# Patient Record
Sex: Female | Born: 1975
Health system: Southern US, Community
[De-identification: ages and names within clinical notes are randomized; demographics above are authoritative.]

## PROBLEM LIST (undated history)

## (undated) ENCOUNTER — Ambulatory Visit (HOSPITAL_COMMUNITY): Discharge status: Absent without leave | Payer: Self-pay

## (undated) DIAGNOSIS — Z9009 Acquired absence of other part of head and neck: Secondary | ICD-10-CM

## (undated) DIAGNOSIS — N6019 Diffuse cystic mastopathy of unspecified breast: Secondary | ICD-10-CM

## (undated) DIAGNOSIS — N809 Endometriosis, unspecified: Secondary | ICD-10-CM

## (undated) DIAGNOSIS — E890 Postprocedural hypothyroidism: Secondary | ICD-10-CM

## (undated) DIAGNOSIS — N879 Dysplasia of cervix uteri, unspecified: Secondary | ICD-10-CM

## (undated) DIAGNOSIS — E079 Disorder of thyroid, unspecified: Secondary | ICD-10-CM

## (undated) HISTORY — PX: THYROIDECTOMY: SHX17

## (undated) HISTORY — DX: Diffuse cystic mastopathy of unspecified breast: N60.19

## (undated) HISTORY — DX: Dysplasia of cervix uteri, unspecified: N87.9

## (undated) HISTORY — DX: Acquired absence of other part of head and neck: Z90.09

## (undated) HISTORY — DX: Postprocedural hypothyroidism: E89.0

## (undated) HISTORY — DX: Endometriosis, unspecified: N80.9

## (undated) HISTORY — PX: APPENDECTOMY: SHX54

## (undated) HISTORY — PX: COLPOSCOPY: SHX161

---
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
## 1999-09-14 ENCOUNTER — Other Ambulatory Visit: Admission: RE | Admit: 1999-09-14 | Discharge: 1999-09-14 | Payer: Self-pay | Admitting: *Deleted

## 2000-10-20 ENCOUNTER — Other Ambulatory Visit: Admission: RE | Admit: 2000-10-20 | Discharge: 2000-10-20 | Payer: Self-pay | Admitting: Obstetrics and Gynecology

## 2003-03-11 ENCOUNTER — Other Ambulatory Visit: Admission: RE | Admit: 2003-03-11 | Discharge: 2003-03-11 | Payer: Self-pay | Admitting: *Deleted

## 2003-05-01 ENCOUNTER — Encounter: Admission: RE | Admit: 2003-05-01 | Discharge: 2003-05-01 | Payer: Self-pay | Admitting: Endocrinology

## 2003-05-01 ENCOUNTER — Encounter: Payer: Self-pay | Admitting: Endocrinology

## 2003-05-16 ENCOUNTER — Ambulatory Visit (HOSPITAL_COMMUNITY): Admission: RE | Admit: 2003-05-16 | Discharge: 2003-05-16 | Payer: Self-pay | Admitting: Endocrinology

## 2003-05-16 ENCOUNTER — Encounter: Payer: Self-pay | Admitting: Endocrinology

## 2003-06-30 ENCOUNTER — Ambulatory Visit (HOSPITAL_COMMUNITY): Admission: RE | Admit: 2003-06-30 | Discharge: 2003-07-01 | Payer: Self-pay | Admitting: Surgery

## 2004-02-13 ENCOUNTER — Other Ambulatory Visit: Admission: RE | Admit: 2004-02-13 | Discharge: 2004-02-13 | Payer: Self-pay | Admitting: Obstetrics and Gynecology

## 2004-03-30 ENCOUNTER — Inpatient Hospital Stay (HOSPITAL_COMMUNITY): Admission: AD | Admit: 2004-03-30 | Discharge: 2004-03-30 | Payer: Self-pay | Admitting: Obstetrics and Gynecology

## 2004-06-18 ENCOUNTER — Ambulatory Visit (HOSPITAL_COMMUNITY): Admission: RE | Admit: 2004-06-18 | Discharge: 2004-06-18 | Payer: Self-pay | Admitting: Obstetrics and Gynecology

## 2004-08-23 ENCOUNTER — Inpatient Hospital Stay (HOSPITAL_COMMUNITY): Admission: AD | Admit: 2004-08-23 | Discharge: 2004-08-26 | Payer: Self-pay | Admitting: Obstetrics and Gynecology

## 2004-10-04 ENCOUNTER — Other Ambulatory Visit: Admission: RE | Admit: 2004-10-04 | Discharge: 2004-10-04 | Payer: Self-pay | Admitting: Obstetrics and Gynecology

## 2004-12-03 ENCOUNTER — Encounter: Admission: RE | Admit: 2004-12-03 | Discharge: 2005-01-02 | Payer: Self-pay | Admitting: Obstetrics and Gynecology

## 2005-06-22 ENCOUNTER — Ambulatory Visit (HOSPITAL_COMMUNITY): Admission: RE | Admit: 2005-06-22 | Discharge: 2005-06-22 | Payer: Self-pay | Admitting: Obstetrics and Gynecology

## 2005-08-21 ENCOUNTER — Inpatient Hospital Stay (HOSPITAL_COMMUNITY): Admission: AD | Admit: 2005-08-21 | Discharge: 2005-08-23 | Payer: Self-pay | Admitting: Obstetrics and Gynecology

## 2005-10-04 ENCOUNTER — Other Ambulatory Visit: Admission: RE | Admit: 2005-10-04 | Discharge: 2005-10-04 | Payer: Self-pay | Admitting: Obstetrics and Gynecology

## 2011-08-16 DIAGNOSIS — N879 Dysplasia of cervix uteri, unspecified: Secondary | ICD-10-CM | POA: Insufficient documentation

## 2011-08-16 HISTORY — DX: Dysplasia of cervix uteri, unspecified: N87.9

## 2012-07-16 ENCOUNTER — Emergency Department (HOSPITAL_COMMUNITY)
Admission: EM | Admit: 2012-07-16 | Discharge: 2012-07-16 | Disposition: A | Discharge status: Home / Self Care | Payer: Self-pay | Source: Home / Self Care

## 2012-07-16 ENCOUNTER — Encounter (HOSPITAL_COMMUNITY): Payer: Self-pay

## 2012-07-16 DIAGNOSIS — Z760 Encounter for issue of repeat prescription: Secondary | ICD-10-CM

## 2012-07-16 DIAGNOSIS — E039 Hypothyroidism, unspecified: Secondary | ICD-10-CM

## 2012-07-16 HISTORY — DX: Disorder of thyroid, unspecified: E07.9

## 2012-07-16 LAB — TSH: TSH: 5.805 u[IU]/mL — ABNORMAL HIGH (ref 0.350–4.500)

## 2012-07-16 MED ORDER — LEVOTHYROXINE SODIUM 125 MCG PO TABS: 125.0000 ug | ORAL_TABLET | Freq: Every day | ORAL | Status: DC

## 2012-07-16 NOTE — ED Notes (Signed)
Patient states just had physical [facility]  (07/03/2012) needs prescription for thyroid medication

## 2012-07-16 NOTE — ED Provider Notes (Addendum)
History     CSN: [id number]  Arrival date & time 07/16/12  1307   First MD Initiated Contact with Patient 07/16/12 1329      Chief Complaint  Patient presents with  . 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 Denies any headache, blurry vision, hoarseness of voice, chest pain, shortness of breath, cough, nausea, vomiting, ear pain, fever, chills, abdominal pain, bowel or urinary symptoms. She is currently using IUD and denies any menstrual symptoms. She denies any change in her weight or appetite. Past Medical History  Diagnosis Date  . Thyroid disease     Past Surgical History  Procedure Date  . Appendectomy     No family history on file.  History  Substance Use Topics  . Smoking status: Denies smoking  . Smokeless tobacco: Not on file  . Alcohol Use: denies etoh use    OB History    Grav Para Term Preterm Abortions TAB SAB Ect Mult Living                  Review of Systems As Outlined in history of present illness.  Allergies  Latex and Morphine and related  Home Medications   Current Outpatient Rx  Name  Route  Sig  Dispense  Refill  . L-LYSINE 1000 MG PO TABS   Oral   Take by mouth.         Marland Kitchen MAGNESIUM 500 MG PO TABS   Oral   Take by mouth.         . ONE-DAILY MULTI VITAMINS PO TABS   Oral   Take 1 tablet by mouth daily.         Marland Kitchen LEVOTHYROXINE SODIUM 125 MCG PO TABS   Oral   Take 1 tablet (125 mcg total) by mouth daily.   30 tablet   5     BP 112/75  Pulse 81  Temp 99.6 F (37.6 C) (Oral)  Resp 19  SpO2 100%  Physical Exam Aged female in no acute distress HEENT: No pallor, no icterus, moist oral mucosa Chest: Clear to auscultation bilaterally CVS: S1-S2 normal: No murmurs Abdomen: Soft, nontender Extremities: Warm, no edema CNS: AAO x3 ED Course  Procedures  (including critical care time)   Labs Reviewed  TSH ordered    No results found.   Assessment/ plan Middle Aged female here for medication refill for Synthroid which she takes 125 MCG daily. Not checked her TSH level in several months. She denies any symptoms. I will refill her Synthroid dose and check a TSH level and adjust dose accordingly if not normal. Counseled on  regular exercise.  Follow Up in 6 months  Total time spent : 15 minutes    MDM         Torianna Junio, MD 07/16/12 1352    ADDENDUM :  Patient's TSH elevated at 5.08. Will increase synthroid dose to 150 mcg daily. Needs follow up in 3  Months  to have TSH rechecked and dose readjusted.   Eddie North, MD 07/17/12 4098

## 2012-07-17 ENCOUNTER — Telehealth (HOSPITAL_COMMUNITY): Payer: Self-pay

## 2013-03-10 ENCOUNTER — Other Ambulatory Visit: Payer: Self-pay | Admitting: Internal Medicine

## 2013-03-12 ENCOUNTER — Other Ambulatory Visit: Payer: Self-pay | Admitting: Internal Medicine

## 2013-03-15 ENCOUNTER — Other Ambulatory Visit: Payer: Self-pay | Admitting: Family Medicine

## 2013-03-15 DIAGNOSIS — N644 Mastodynia: Secondary | ICD-10-CM

## 2013-04-01 ENCOUNTER — Other Ambulatory Visit: Payer: Self-pay | Admitting: Family Medicine

## 2013-04-01 ENCOUNTER — Other Ambulatory Visit: Payer: Self-pay

## 2013-04-01 ENCOUNTER — Ambulatory Visit
Admission: RE | Admit: 2013-04-01 | Discharge: 2013-04-01 | Disposition: A | Discharge status: Home / Self Care | Payer: BC Managed Care – PPO | Source: Ambulatory Visit | Attending: Family Medicine | Admitting: Family Medicine

## 2013-04-01 DIAGNOSIS — N644 Mastodynia: Secondary | ICD-10-CM

## 2013-05-03 ENCOUNTER — Inpatient Hospital Stay (HOSPITAL_COMMUNITY): Admission: RE | Admit: 2013-05-03 | Payer: Self-pay | Source: Ambulatory Visit | Admitting: Obstetrics & Gynecology

## 2013-11-22 IMAGING — MG MM DIGITAL DIAGNOSTIC BILAT
7 series · 7 of 7 positions shown · non-contrast
Comparison: None.

CLINICAL DATA: Patient presents for a bilateral diagnostic
mammogram due to diffuse bilateral breast pain and tenderness left
worse than right.  The patient also describes two small adjacent
palpable tender abnormalities towards the left axilla as well as a
single small palpable tender abnormality over the right axilla.

DIGITAL DIAGNOSTIC BILATERAL MAMMOGRAM WITH CAD AND BILATERAL
BREAST ULTRASOUND:

[L MLO]
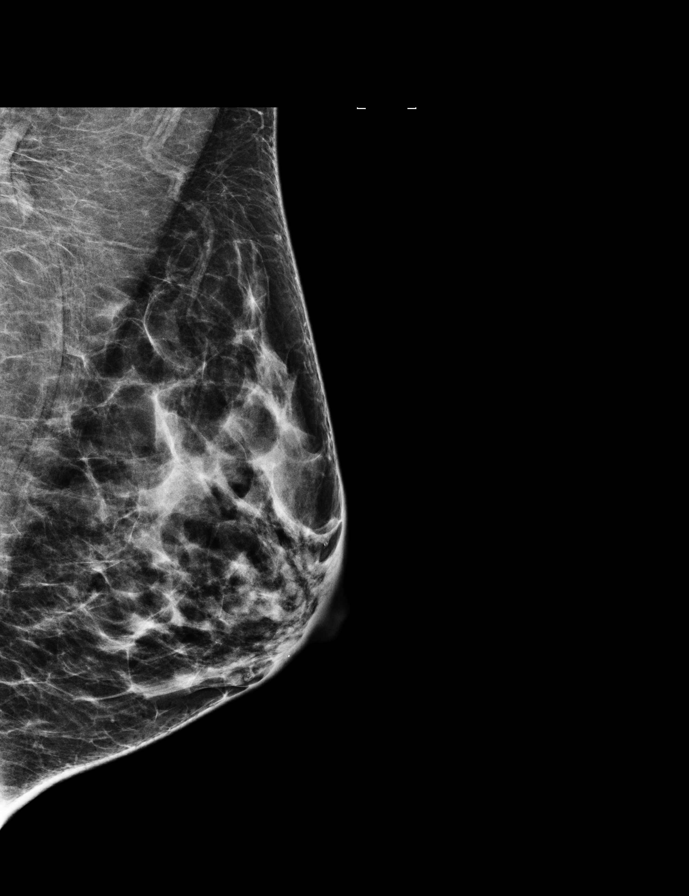

[R MLO]
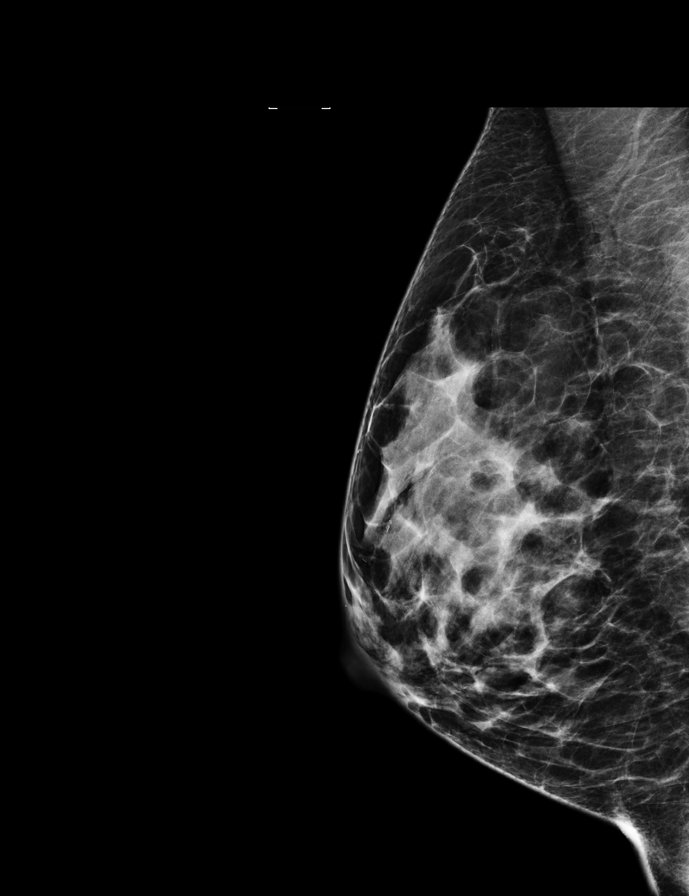

[L CC]
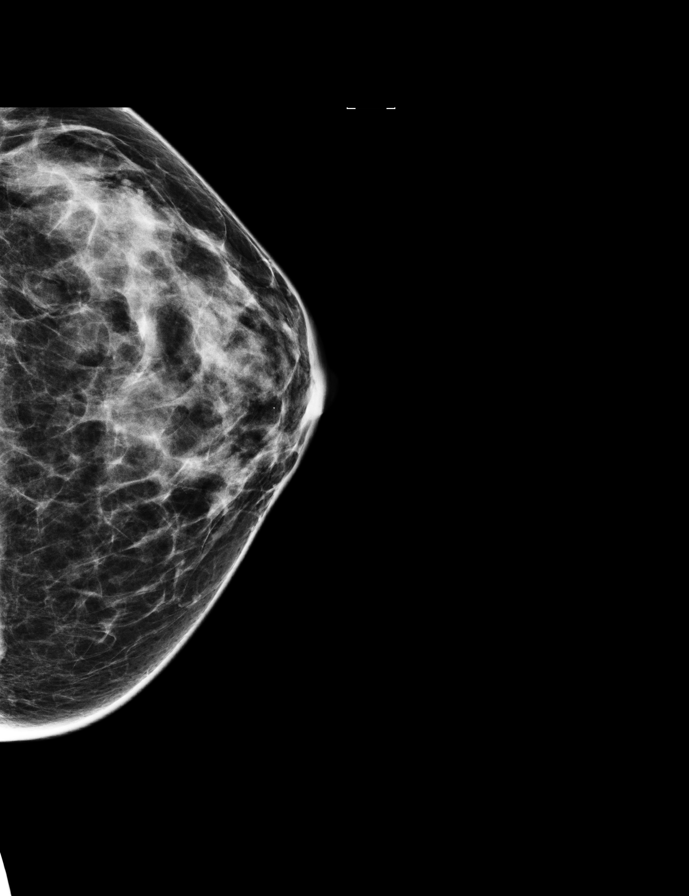

[L TAN]
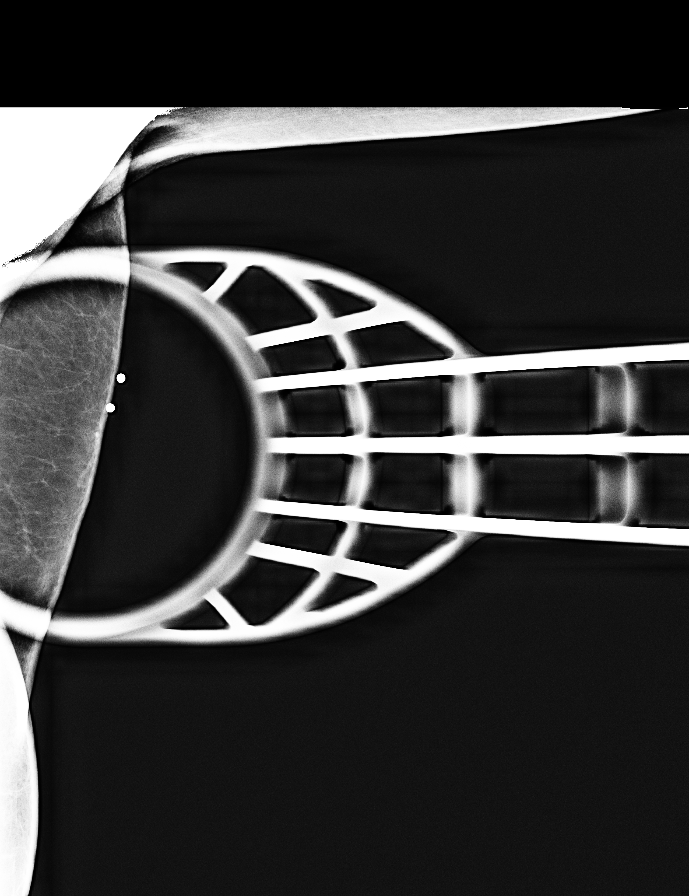

[R TAN]
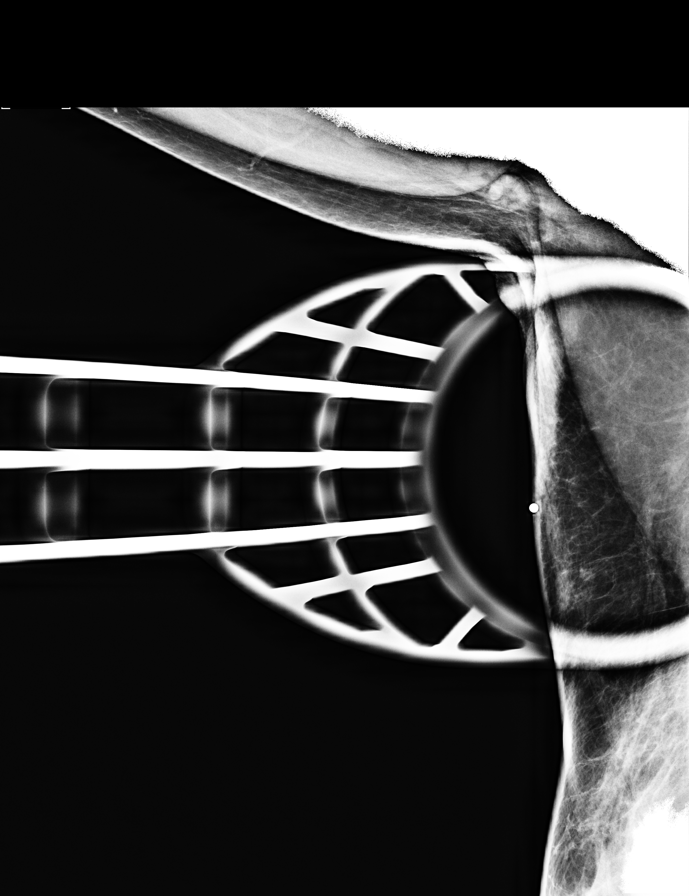

[R CC]
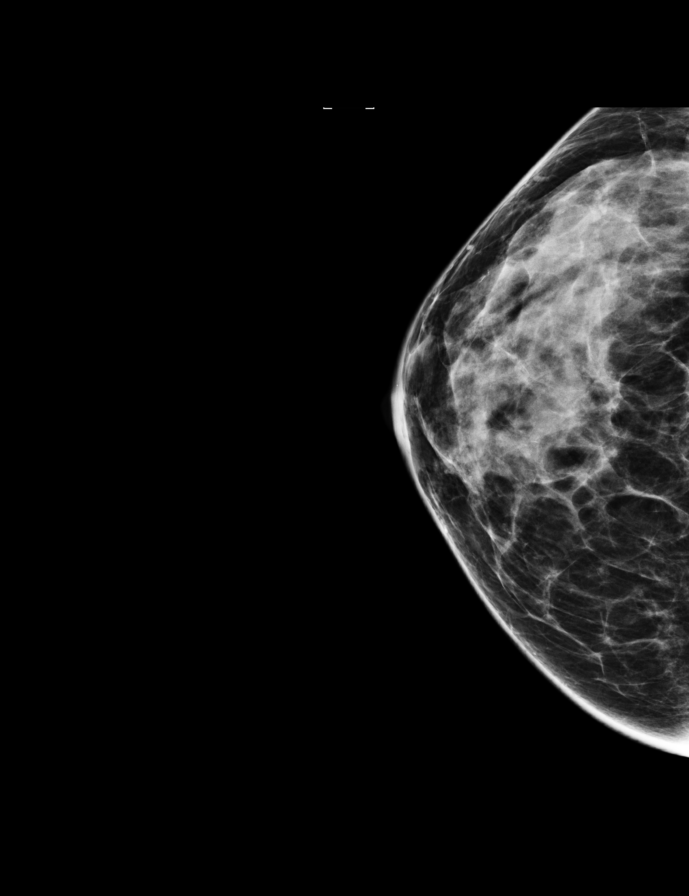

[L AT]
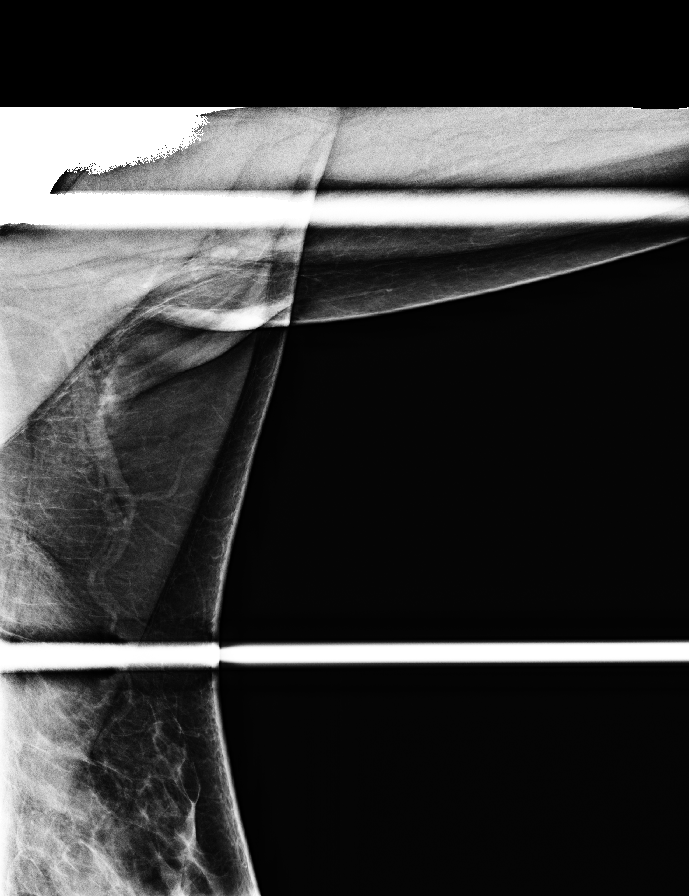

[7 of 7 positions shown; findings below may reference images not displayed]

FINDINGS: ACR Breast Density Category c:  The breast tissue is
heterogeneously dense, which may obscure small masses.

Examination demonstrates no focal abnormality over the axillary
region bilaterally to account for patient's palpable areas of
tenderness.  There are a few several bilateral vascular
calcifications present. No spiculated mass, distortion or
suspicious microcalcifications.

Mammographic images were processed with CAD.

On physical exam, I palpate no focal abnormality over the left
axillary region to correspond to patient's palpable abnormalities.
I palpate a very soft ovoid 8 mm palpable abnormality in the right
axillary region to correspond to patient's palpable abnormality.

Ultrasound is performed, showing no focal abnormality over the left
axillary region/upper outer quadrant to correspond to patient's
palpable abnormalities.  Ultrasound of the right axillary region
demonstrates a well defined ovoid hypoechoic mass just below the
skin measuring 3 x 8 x 8 mm corresponding to patient's palpable
area of tenderness.  This is compatible with a small lipoma.
IMPRESSION: No focal abnormality over the left axilla/upper outer breast to
correspond to patient's two adjacent palpable abnormalities.  3 x 8
x 8 mm lipoma over the right axilla corresponding to patient's
palpable abnormality.

RECOMMENDATION:
Recommend continued management of patient's left breast palpable
abnormalities and bilateral breast pain on a clinical basis.
Otherwise, recommend continued annual bilateral screening
mammography beginning at age 40.

I have discussed the findings and recommendations with the patient.
Results were also provided in writing at the conclusion of the
visit.  If applicable, a reminder letter will be sent to the
patient regarding the next appointment.

BI-RADS CATEGORY 2:  Benign finding(s).

## 2015-11-17 ENCOUNTER — Ambulatory Visit: Payer: Self-pay | Admitting: Gynecology

## 2015-12-02 ENCOUNTER — Other Ambulatory Visit: Payer: Self-pay | Admitting: Gynecology

## 2015-12-02 ENCOUNTER — Encounter: Payer: Self-pay | Admitting: Gynecology

## 2015-12-02 ENCOUNTER — Ambulatory Visit (INDEPENDENT_AMBULATORY_CARE_PROVIDER_SITE_OTHER): Payer: Self-pay | Admitting: Gynecology

## 2015-12-02 ENCOUNTER — Telehealth: Payer: Self-pay

## 2015-12-02 VITALS — BP 116/74 | Ht 66.0 in | Wt 133.0 lb

## 2015-12-02 DIAGNOSIS — Z01419 Encounter for gynecological examination (general) (routine) without abnormal findings: Secondary | ICD-10-CM

## 2015-12-02 MED ORDER — THYROID 65 MG PO TABS: 65.0000 mg | ORAL_TABLET | Freq: Every day | ORAL | Status: DC

## 2015-12-02 MED ORDER — THYROID 30 MG PO TABS: 30.0000 mg | ORAL_TABLET | Freq: Every day | ORAL | Status: DC

## 2015-12-02 MED ORDER — THYROID 65 MG PO TABS: ORAL_TABLET | ORAL | Status: DC

## 2015-12-02 NOTE — Progress Notes (Signed)
    Belinda OatsKelly E Tucker 26-Oct-1975 [id number]        40 y.o.  G2P2 new patient to establish care. History to include abnormal Pap smear 2013 fortunately had colposcopy but no reported treatment. Also history of laparoscopy 1996 with diagnosis of endometriosis. Having regular monthly menses. Not using contraception as she is not sexually active. Also has a history of total thyroidectomy for goiter and is on thyroid replacement which she has run out of one of her medications.  Patient has no reported GYN issues at this time  Past medical history,surgical history, problem list, medications, allergies, family history and social history were all reviewed and documented as reviewed in the EPIC chart.  ROS:  Performed with pertinent positives and negatives included in the history, assessment and plan.   Additional significant findings :  none   Exam: Kennon PortelaKim Gardner assistant Filed Vitals:   12/02/15 1132  BP: 116/74  Height: 5\' 6"  (1.676 m)  Weight: 133 lb (60.328 kg)   General appearance:  Normal affect, orientation and appearance. Skin: Grossly normal HEENT: Without gross lesions.  No cervical or supraclavicular adenopathy. Thyroid normal.  Lungs:  Clear without wheezing, rales or rhonchi Cardiac: RR, without RMG Abdominal:  Soft, nontender, without masses, guarding, rebound, organomegaly or hernia Breasts:  Examined lying and sitting without masses, retractions, discharge or axillary adenopathy. Pelvic:  Ext/BUS/vagina normal  Cervix normal. Pap smear done  Uterus anteverted, normal size, shape and contour, midline and mobile nontender   Adnexa without masses or tenderness    Anus and perineum normal   Rectovaginal normal sphincter tone without palpated masses or tenderness.    Assessment/Plan:  40 y.o. G2P2 female for annual exam with regular menses, abstinent contraception.   1. History of thyroidectomy. Requesting refills of her Armour Thyroid. She takes 65 mg in the morning and 15 mg  in the afternoon. I refilled her 2 months. We will obtain copies of her most recent lab work that she reports about 3-4 months ago at another facility. If for some reason we cannot obtain these and I'll check a baseline TSH before refilling her again. Did not check today as she has not been taking her 15 mg dose as she ran out. 2. History of cervical dysplasia. I do not have copies of these Pap smears but she reports no treatment rendered other than colposcopy and a negative Pap smear afterwards. Pap smear was done today. Assuming negative then we'll follow expectantly. 3. Breast health. Reports mammogram 2-1/2 years ago. No abnormalities on exam. Reviewed screening mammographic recommendations and she plans on repeating her mammogram next year at age 40. SBE monthly reviewed. 4. Contraception. Patient not sexually active and does not plan to be any time soon. Will follow up for contraceptive discussion if/when she anticipates reinitiation. 5. Health maintenance. No routine lab work done as patient reports routine lab work done at another facility this past year and she is paying cash and prefers not to have any lab work done at this time. Follow up with her labwork from elsewhere for my review otherwise follow up in 2 months before she runs out of her thyroid replacement for check of TSH.   Dara LordsFONTAINE,[place] Perry P MD, 12:11 PM 12/02/2015

## 2015-12-02 NOTE — Telephone Encounter (Signed)
I spoke with patient who confirmed she is on 65 mg. . I called the pharmacy back and upon review he told me what she has been taking is Ashby Dawesature Thyroid 65 mg. Just a different brand from Armour but substituable with each other. I called patient and she confirmed the Ashby Dawesature brand is what she has been on. I sent Rx for that to pharmacy. Patient informed.

## 2015-12-02 NOTE — Addendum Note (Signed)
Addended by: Dayna BarkerGARDNER, Alexah Kivett K on: 12/02/2015 12:25 PM   Modules accepted: Orders

## 2015-12-02 NOTE — Telephone Encounter (Signed)
Please check with the patient and tell her what the pharmacist said and see if 60 mg is the appropriate dose versus 65 like she listed for us. If so then let the pharmacy know

## 2015-12-02 NOTE — Telephone Encounter (Signed)
Pharmacist said he received Rx for Armour Thyroid 65 mg. But it does not come in 65 mg. It does come in 60 mg.

## 2015-12-02 NOTE — Addendum Note (Signed)
Addended by: Keenan BachelorANNAS, Erica Richwine R on: 12/02/2015 02:18 PM   Modules accepted: Orders

## 2015-12-02 NOTE — Patient Instructions (Signed)

## 2015-12-03 LAB — PAP IG W/ RFLX HPV ASCU

## 2015-12-14 ENCOUNTER — Telehealth: Payer: Self-pay | Admitting: *Deleted

## 2015-12-14 NOTE — Telephone Encounter (Signed)
Pt blood work from [facility] came I will place on your desk to review.

## 2015-12-15 MED ORDER — THYROID 65 MG PO TABS: ORAL_TABLET | ORAL | Status: DC

## 2015-12-15 NOTE — Telephone Encounter (Signed)
Tell patient her labs all look good. This includes her thyroid. Refill her thyroid medication 1 year. They did a testosterone level which was normal. She had a minimal elevation in the sex hormone binding globulin which is not unusual and nothing needs to be done with this.  Not sure why they ordered it.

## 2015-12-15 NOTE — Telephone Encounter (Signed)
Pt informed with the below,pt [facility] the [facility] 65 mg brand sent to pharmacy. Rx sent.

## 2016-02-01 ENCOUNTER — Other Ambulatory Visit: Payer: Self-pay

## 2016-02-22 ENCOUNTER — Other Ambulatory Visit: Payer: Self-pay | Admitting: Gynecology

## 2016-02-22 NOTE — Telephone Encounter (Signed)
Okay to refill however she normally takes it

## 2016-07-15 ENCOUNTER — Other Ambulatory Visit: Payer: Self-pay | Admitting: Gynecology

## 2016-10-27 ENCOUNTER — Other Ambulatory Visit: Payer: Self-pay | Admitting: Gynecology

## 2016-12-05 ENCOUNTER — Encounter: Payer: Self-pay | Admitting: Gynecology

## 2016-12-26 ENCOUNTER — Ambulatory Visit (INDEPENDENT_AMBULATORY_CARE_PROVIDER_SITE_OTHER): Payer: Self-pay | Admitting: Gynecology

## 2016-12-26 ENCOUNTER — Encounter: Payer: Self-pay | Admitting: Gynecology

## 2016-12-26 VITALS — BP 116/70 | Ht 65.5 in | Wt 122.0 lb

## 2016-12-26 DIAGNOSIS — E039 Hypothyroidism, unspecified: Secondary | ICD-10-CM

## 2016-12-26 DIAGNOSIS — Z01419 Encounter for gynecological examination (general) (routine) without abnormal findings: Secondary | ICD-10-CM

## 2016-12-26 LAB — TSH: TSH: 3.89 m[IU]/L

## 2016-12-26 MED ORDER — THYROID 30 MG PO TABS: ORAL_TABLET | ORAL | 12 refills | Status: DC

## 2016-12-26 MED ORDER — THYROID 65 MG PO TABS
65.0000 mg | ORAL_TABLET | Freq: Every morning | ORAL | 12 refills | Status: DC
Start: 2016-12-26 — End: 2018-01-25

## 2016-12-26 NOTE — Patient Instructions (Signed)
Followup in one year for annual exam, sooner if any issues 

## 2016-12-26 NOTE — Progress Notes (Signed)
    Belinda OatsKelly E Tucker Oct 06, 1975 [id number]        41 y.o.  G2P2 for annual exam.  Doing well without complaints  Past medical history,surgical history, problem list, medications, allergies, family history and social history were all reviewed and documented as reviewed in the EPIC chart.  ROS:  Performed with pertinent positives and negatives included in the history, assessment and plan.   Additional significant findings :  None   Exam: Kennon PortelaKim Gardner assistant Vitals:   12/26/16 1125  BP: 116/70  Weight: 122 lb (55.3 kg)  Height: 5' 5.5" (1.664 m)   Body mass index is 19.99 kg/m.  General appearance:  Normal affect, orientation and appearance. Skin: Grossly normal HEENT: Without gross lesions.  No cervical or supraclavicular adenopathy. Thyroid normal.  Lungs:  Clear without wheezing, rales or rhonchi Cardiac: RR, without RMG Abdominal:  Soft, nontender, without masses, guarding, rebound, organomegaly or hernia Breasts:  Examined lying and sitting without masses, retractions, discharge or axillary adenopathy. Pelvic:  Ext, BUS, Vagina: Light menses flow  Cervix: Light menses flow  Uterus: Anteverted, normal size, shape and contour, midline and mobile nontender   Adnexa: Without masses or tenderness    Anus and perineum: Normal   Rectovaginal: Normal sphincter tone without palpated masses or tenderness.    Assessment/Plan:  41 y.o. G2P2 female for annual exam with regular menses, condom contraception.   1. Occasionally sexually active. Uses condoms and is happy with this. Follow up if she wants to discuss alternative contraception. 2. History of thyroidectomy. Is using Armour Thyroid 65 mg in a.m. and 15 mg in p.m. doing well with this. Check TSH today. Refill for both 1 year provided. 3. Pap smear 2017. History of dysplasia in the past with normal Pap smears afterwards and no treatment rendered at that time. Plan follow up Pap smear at 3 year interval. 4. Mammography 2014.  Recommended screening mammogram now. Patient fearful of radiation exposure and asked about alternatives to include thermography and ultrasound. I reviewed the current recommendations for screening is mammography. I did review ACOG, ACS and [place] health preventative task force recommendations. Patient is going to decide when she will initiate mammography. SBE monthly reviewed. 5. Health maintenance. Patient is self-pay and requests minimal lab work. I reviewed with her my standard would be CBC, CMP, lipid profile along with her TSH. Patient declined the lab work other than TSH. States that she is active and eats a healthy diet and took declines other screening. Follow up in one year, sooner as needed.   Dara LordsFONTAINE,Trumaine Wimer P MD, 12:01 PM 12/26/2016

## 2017-01-11 ENCOUNTER — Encounter: Payer: Self-pay | Admitting: Gynecology

## 2017-05-08 ENCOUNTER — Other Ambulatory Visit: Payer: Self-pay | Admitting: Gynecology

## 2017-05-17 ENCOUNTER — Telehealth: Payer: Self-pay | Admitting: *Deleted

## 2017-05-17 MED ORDER — THYROID 30 MG PO TABS: ORAL_TABLET | ORAL | 7 refills | Status: DC

## 2017-05-17 NOTE — Telephone Encounter (Signed)
Okay 

## 2017-05-17 NOTE — Telephone Encounter (Signed)
Rx sent 

## 2017-05-17 NOTE — Telephone Encounter (Signed)
[facility] pharmacist called stating they have been having a issue trying to get patient Nature-throid 65 mg tablet. The pharmacy said they have no trouble getting NP thyroid 30 mg tablet, to solve this issue with the Nature-throid patient can take NP thyroid 30 mg 2 1/2 tablets daily at breakfast #75 with refills.  Is this okay?

## 2017-12-28 ENCOUNTER — Encounter: Payer: Self-pay | Admitting: Gynecology

## 2018-01-25 ENCOUNTER — Encounter: Payer: Self-pay | Admitting: Gynecology

## 2018-01-25 ENCOUNTER — Ambulatory Visit (INDEPENDENT_AMBULATORY_CARE_PROVIDER_SITE_OTHER): Payer: Self-pay | Admitting: Gynecology

## 2018-01-25 VITALS — BP 130/80 | Ht 65.5 in | Wt 121.0 lb

## 2018-01-25 DIAGNOSIS — Z13220 Encounter for screening for lipoid disorders: Secondary | ICD-10-CM

## 2018-01-25 DIAGNOSIS — E038 Other specified hypothyroidism: Secondary | ICD-10-CM

## 2018-01-25 DIAGNOSIS — Z01419 Encounter for gynecological examination (general) (routine) without abnormal findings: Secondary | ICD-10-CM

## 2018-01-25 MED ORDER — THYROID 30 MG PO TABS: ORAL_TABLET | ORAL | 12 refills | Status: DC

## 2018-01-25 NOTE — Patient Instructions (Signed)
Call to Schedule your mammogram  Facilities in [place]: 1)  [facility]. [facility], [address] Phone: [phone] 2)  Dr. Bertrand at [facility]  [address] Phone: [phone]     Mammogram A mammogram is an X-ray test to find changes in a woman's breast. You should get a mammogram if:  You are 42 years of age or older  You have risk factors.   Your doctor recommends that you have one.  BEFORE THE TEST  Do not schedule the test the week before your period, especially if your breasts are sore during this time.  On the day of your mammogram:  Wash your breasts and armpits well. After washing, do not put on any deodorant or talcum powder on until after your test.   Eat and drink as you usually do.   Take your medicines as usual.   If you are diabetic and take insulin, make sure you:   Eat before coming for your test.   Take your insulin as usual.   If you cannot keep your appointment, call before the appointment to cancel. Schedule another appointment.  TEST  You will need to undress from the waist up. You will put on a hospital gown.   Your breast will be put on the mammogram machine, and it will press firmly on your breast with a piece of plastic called a compression paddle. This will make your breast flatter so that the machine can X-ray all parts of your breast.   Both breasts will be X-rayed. Each breast will be X-rayed from above and from the side. An X-ray might need to be taken again if the picture is not good enough.   The mammogram will last about 15 to 30 minutes.  AFTER THE TEST Finding out the results of your test Ask when your test results will be ready. Make sure you get your test results.  Document Released: 10/28/2008 Document Revised: 07/21/2011 Document Reviewed: 10/28/2008 ExitCare Patient Information 2012 [employer].   

## 2018-01-25 NOTE — Progress Notes (Signed)
    Sheila OatsKelly E Ager [birth year] [id number]        42 y.o.  G2P2 for annual gynecologic exam.  Without gynecologic complaints.  Past medical history,surgical history, problem list, medications, allergies, family history and social history were all reviewed and documented as reviewed in the EPIC chart.  ROS:  Performed with pertinent positives and negatives included in the history, assessment and plan.   Additional significant findings : None   Exam: Kennon PortelaKim Gardner assistant Vitals:   01/25/18 1132  BP: 130/80  Weight: 121 lb (54.9 kg)  Height: 5' 5.5" (1.664 m)   Body mass index is 19.83 kg/m.  General appearance:  Normal affect, orientation and appearance. Skin: Grossly normal HEENT: Without gross lesions.  No cervical or supraclavicular adenopathy. Thyroid normal.  Lungs:  Clear without wheezing, rales or rhonchi Cardiac: RR, without RMG Abdominal:  Soft, nontender, without masses, guarding, rebound, organomegaly or hernia Breasts:  Examined lying and sitting without masses, retractions, discharge or axillary adenopathy. Pelvic:  Ext, BUS, Vagina: Normal  Cervix: Normal  Uterus: Anteverted, normal size, shape and contour, midline and mobile nontender   Adnexa: Without masses or tenderness    Anus and perineum: Normal   Rectovaginal: Normal sphincter tone without palpated masses or tenderness.    Assessment/Plan:  42 y.o. G2P2 female for annual gynecologic exam with regular menses, abstinent contraception.   1. Contraception.  Patient not sexually active.  Uses condoms when she is which is acceptable. 2. Status post thyroidectomy on thyroid replacement.  Check TSH today.  Refill of her thyroid medication x1 year provided. 3. Pap smear reported through the health department 2018.  Pap smear here 2017 normal.  Patient prefers to be followed for her Pap smears at the health department due to financial reasons. 4. Mammography 2014.  Recommended screening mammogram now as she is 41  and she agrees to arrange.  Names and numbers provided. 5. Health maintenance.  Baseline CBC, glucose and lipid profile ordered with her TSH.  Follow-up in 1 year, sooner as needed.   Dara Lordsimothy P Harish Bram MD, 12:01 PM 01/25/2018

## 2018-01-26 LAB — CBC WITH DIFFERENTIAL/PLATELET
Basophils Absolute: 29 cells/uL (ref 0–200)
Basophils Relative: 0.7 %
EOS ABS: 42 {cells}/uL (ref 15–500)
Eosinophils Relative: 1 %
HEMATOCRIT: 34.4 % — AB (ref 35.0–45.0)
Hemoglobin: 12.2 g/dL (ref 11.7–15.5)
LYMPHS ABS: 1806 {cells}/uL (ref 850–3900)
MCH: 31.6 pg (ref 27.0–33.0)
MCHC: 35.5 g/dL (ref 32.0–36.0)
MCV: 89.1 fL (ref 80.0–100.0)
MPV: 8.8 fL (ref 7.5–12.5)
Monocytes Relative: 8.4 %
NEUTROS PCT: 46.9 %
Neutro Abs: 1970 cells/uL (ref 1500–7800)
Platelets: 233 10*3/uL (ref 140–400)
RBC: 3.86 10*6/uL (ref 3.80–5.10)
RDW: 12.1 % (ref 11.0–15.0)
Total Lymphocyte: 43 %
WBC: 4.2 10*3/uL (ref 3.8–10.8)
WBCMIX: 353 {cells}/uL (ref 200–950)

## 2018-01-26 LAB — GLUCOSE, RANDOM: Glucose, Bld: 100 mg/dL — ABNORMAL HIGH (ref 65–99)

## 2018-01-26 LAB — LIPID PANEL
CHOL/HDL RATIO: 2.2 (calc) (ref <5.0)
Cholesterol: 139 mg/dL (ref <200)
HDL: 62 mg/dL (ref >50)
LDL Cholesterol (Calc): 64 mg/dL (calc)
NON-HDL CHOLESTEROL (CALC): 77 mg/dL (ref <130)
TRIGLYCERIDES: 51 mg/dL (ref <150)

## 2018-01-26 LAB — TSH: TSH: 4.27 mIU/L

## 2018-02-14 ENCOUNTER — Telehealth: Payer: Self-pay | Admitting: *Deleted

## 2018-02-14 NOTE — Telephone Encounter (Signed)
Patient called stating her thyroid medication was sent to wrong pharmacy. I called patient back and left detailed message [facility] if this is incorrect

## 2018-05-29 ENCOUNTER — Inpatient Hospital Stay (HOSPITAL_COMMUNITY)
Admission: RE | Admit: 2018-05-29 | Discharge: 2018-06-04 | DRG: 885 | Disposition: A | Discharge status: Home / Self Care | Payer: Federal, State, Local not specified - Other | Attending: Psychiatry | Admitting: Psychiatry

## 2018-05-29 ENCOUNTER — Encounter (HOSPITAL_COMMUNITY): Payer: Self-pay | Admitting: *Deleted

## 2018-05-29 ENCOUNTER — Other Ambulatory Visit: Payer: Self-pay

## 2018-05-29 DIAGNOSIS — Z885 Allergy status to narcotic agent status: Secondary | ICD-10-CM | POA: Diagnosis not present

## 2018-05-29 DIAGNOSIS — E890 Postprocedural hypothyroidism: Secondary | ICD-10-CM | POA: Diagnosis present

## 2018-05-29 DIAGNOSIS — Z79899 Other long term (current) drug therapy: Secondary | ICD-10-CM | POA: Diagnosis not present

## 2018-05-29 DIAGNOSIS — F419 Anxiety disorder, unspecified: Secondary | ICD-10-CM | POA: Diagnosis present

## 2018-05-29 DIAGNOSIS — Z91040 Latex allergy status: Secondary | ICD-10-CM | POA: Diagnosis not present

## 2018-05-29 DIAGNOSIS — R45851 Suicidal ideations: Secondary | ICD-10-CM | POA: Diagnosis present

## 2018-05-29 DIAGNOSIS — G4700 Insomnia, unspecified: Secondary | ICD-10-CM | POA: Diagnosis present

## 2018-05-29 DIAGNOSIS — Z811 Family history of alcohol abuse and dependence: Secondary | ICD-10-CM

## 2018-05-29 DIAGNOSIS — F332 Major depressive disorder, recurrent severe without psychotic features: Secondary | ICD-10-CM | POA: Diagnosis present

## 2018-05-29 DIAGNOSIS — Z818 Family history of other mental and behavioral disorders: Secondary | ICD-10-CM | POA: Diagnosis not present

## 2018-05-29 DIAGNOSIS — Z87891 Personal history of nicotine dependence: Secondary | ICD-10-CM

## 2018-05-29 DIAGNOSIS — F1021 Alcohol dependence, in remission: Secondary | ICD-10-CM | POA: Diagnosis present

## 2018-05-29 DIAGNOSIS — R110 Nausea: Secondary | ICD-10-CM | POA: Diagnosis not present

## 2018-05-29 DIAGNOSIS — F322 Major depressive disorder, single episode, severe without psychotic features: Secondary | ICD-10-CM | POA: Diagnosis present

## 2018-05-29 LAB — LIPID PANEL
CHOLESTEROL: 173 mg/dL (ref 0–200)
HDL: 71 mg/dL (ref >40)
LDL Cholesterol: 86 mg/dL (ref 0–99)
Total CHOL/HDL Ratio: 2.4 RATIO
Triglycerides: 82 mg/dL (ref <150)
VLDL: 16 mg/dL (ref 0–40)

## 2018-05-29 LAB — COMPREHENSIVE METABOLIC PANEL
ALBUMIN: 4.1 g/dL (ref 3.5–5.0)
ALK PHOS: 33 U/L — AB (ref 38–126)
ALT: 11 U/L (ref 0–44)
ANION GAP: 9 (ref 5–15)
AST: 14 U/L — ABNORMAL LOW (ref 15–41)
BUN: 15 mg/dL (ref 6–20)
CALCIUM: 9.6 mg/dL (ref 8.9–10.3)
CO2: 25 mmol/L (ref 22–32)
Chloride: 107 mmol/L (ref 98–111)
Creatinine, Ser: 0.85 mg/dL (ref 0.44–1.00)
GFR calc non Af Amer: >60 mL/min (ref >60)
Glucose, Bld: 143 mg/dL — ABNORMAL HIGH (ref 70–99)
POTASSIUM: 3.8 mmol/L (ref 3.5–5.1)
Sodium: 141 mmol/L (ref 135–145)
Total Bilirubin: 1.8 mg/dL — ABNORMAL HIGH (ref 0.3–1.2)
Total Protein: 6.8 g/dL (ref 6.5–8.1)

## 2018-05-29 LAB — URINALYSIS, COMPLETE (UACMP) WITH MICROSCOPIC
BACTERIA UA: NONE SEEN
BILIRUBIN URINE: NEGATIVE
GLUCOSE, UA: 50 mg/dL — AB
KETONES UR: NEGATIVE mg/dL
LEUKOCYTES UA: NEGATIVE
Nitrite: NEGATIVE
Protein, ur: NEGATIVE mg/dL
Specific Gravity, Urine: 1.023 (ref 1.005–1.030)
pH: 5 (ref 5.0–8.0)

## 2018-05-29 LAB — CBC
HCT: 41.5 % (ref 36.0–46.0)
HEMOGLOBIN: 14.2 g/dL (ref 12.0–15.0)
MCH: 31.9 pg (ref 26.0–34.0)
MCHC: 34.2 g/dL (ref 30.0–36.0)
MCV: 93.3 fL (ref 80.0–100.0)
Platelets: 285 10*3/uL (ref 150–400)
RBC: 4.45 MIL/uL (ref 3.87–5.11)
RDW: 12.3 % (ref 11.5–15.5)
WBC: 8.8 10*3/uL (ref 4.0–10.5)
nRBC: 0 % (ref 0.0–0.2)

## 2018-05-29 LAB — RAPID URINE DRUG SCREEN, HOSP PERFORMED
Amphetamines: NOT DETECTED
BARBITURATES: NOT DETECTED
Benzodiazepines: NOT DETECTED
Cocaine: NOT DETECTED
Opiates: NOT DETECTED
TETRAHYDROCANNABINOL: NOT DETECTED

## 2018-05-29 LAB — PREGNANCY, URINE: Preg Test, Ur: NEGATIVE

## 2018-05-29 LAB — HEMOGLOBIN A1C
Hgb A1c MFr Bld: 5.1 % (ref 4.8–5.6)
MEAN PLASMA GLUCOSE: 99.67 mg/dL

## 2018-05-29 LAB — ETHANOL

## 2018-05-29 LAB — TSH: TSH: 1.802 u[IU]/mL (ref 0.350–4.500)

## 2018-05-29 MED ORDER — ESCITALOPRAM OXALATE 5 MG PO TABS
5.0000 mg | ORAL_TABLET | Freq: Every day | ORAL | Status: DC
  Filled 2018-05-29: qty 1

## 2018-05-29 MED ORDER — TRAZODONE HCL 50 MG PO TABS
50.0000 mg | ORAL_TABLET | Freq: Every evening | ORAL | Status: DC | PRN
  Filled 2018-05-29: qty 1

## 2018-05-29 MED ORDER — ESCITALOPRAM OXALATE 5 MG PO TABS
2.5000 mg | ORAL_TABLET | Freq: Every day | ORAL | Status: DC
  Administered 2018-05-29: 2.5 mg via ORAL
  Filled 2018-05-29 (×3): qty 1

## 2018-05-29 MED ORDER — ONDANSETRON HCL 4 MG PO TABS
4.0000 mg | ORAL_TABLET | Freq: Once | ORAL | Status: AC
  Administered 2018-05-29: 4 mg via ORAL
  Filled 2018-05-29 (×2): qty 1

## 2018-05-29 MED ORDER — THYROID 60 MG PO TABS
75.0000 mg | ORAL_TABLET | Freq: Every day | ORAL | Status: DC
  Administered 2018-05-30 – 2018-06-04 (×6): 75 mg via ORAL
  Filled 2018-05-29 (×9): qty 1

## 2018-05-29 MED ORDER — ACETAMINOPHEN 325 MG PO TABS
650.0000 mg | ORAL_TABLET | Freq: Four times a day (QID) | ORAL | Status: DC | PRN
  Administered 2018-05-29: 650 mg via ORAL
  Filled 2018-05-29: qty 2

## 2018-05-29 MED ORDER — ESCITALOPRAM OXALATE 5 MG PO TABS
ORAL_TABLET | ORAL | Status: AC
  Filled 2018-05-29: qty 1

## 2018-05-29 MED ORDER — ESCITALOPRAM OXALATE 5 MG/5ML PO SOLN
2.5000 mg | Freq: Every day | ORAL | Status: DC
  Filled 2018-05-29 (×2): qty 2.5

## 2018-05-29 NOTE — H&P (Signed)
Behavioral Health Medical Screening Exam  Belinda Tucker is an 42 y.o. female presents to [facility] as walk in with complaints that she is unable to care for her self; feels that something is wrong with her medications, isolation, withdrawal, and feeling that her husband is plotting against her.  Patient states that she does not feel safe  Total Time spent with patient: 30 minutes  Psychiatric Specialty Exam: Physical Exam  Constitutional: She is oriented to person, place, and time. She appears well-developed and well-nourished.  Neck: Normal range of motion. Neck supple.  Respiratory: Effort normal.  Musculoskeletal: Normal range of motion.  Neurological: She is alert and oriented to person, place, and time.  Skin: Skin is warm and dry.  Psychiatric: Her behavior is normal. Her mood appears anxious. Thought content is paranoid. Cognition and memory are normal. She expresses impulsivity. She exhibits a depressed mood.    ROS  Blood pressure (!) 147/89, pulse 83, temperature 98.5 F (36.9 C), temperature source Oral, resp. rate 16.There is no height or weight on file to calculate BMI.  General Appearance: Casual  Eye Contact:  Good  Speech:  Clear and Coherent and Normal Rate  Volume:  Normal  Mood:  Depressed  Affect:  Depressed, Flat and Anxious  Thought Process:  Coherent  Orientation:  Full (Time, Place, and Person)  Thought Content:  Paranoid Ideation  Suicidal Thoughts:  No  Homicidal Thoughts:  No  Memory:  Immediate;   Good Recent;   Good Remote;   Good  Judgement:  Impaired  Insight:  Lacking  Psychomotor Activity:  Restlessness  Concentration: Concentration: Fair and Attention Span: Fair  Recall:  Good  Fund of Knowledge:Good  Language: Good  Akathisia:  No  Handed:  Right  AIMS (if indicated):     Assets:  Engineer, maintenance Physical Health Social Support  Sleep:       Musculoskeletal: Strength & Muscle Tone: within normal limits Gait &  Station: normal Patient leans: N/A  Blood pressure (!) 147/89, pulse 83, temperature 98.5 F (36.9 C), temperature source Oral, resp. rate 16.  Recommendations: Inpatient psychiatric treatment  Based on my evaluation the patient does not appear to have an emergency medical condition.  Shuvon Rankin, NP 05/29/2018, 3:26 PM

## 2018-05-29 NOTE — Progress Notes (Addendum)
Pt was observed sitting in bed crying.Pt appears irritable/depressed/anxious/labile in affect and mood. Pt denies SI/HI/AVH at this time. Rates pain 8/10; Headache. Provider on call notified to address medications needs. Pt was demanding to staff this evening. Pt states she is hyper-sensitive to medications. Pt was encourage to talk to provider tomorrow. PRN tylenol requested and given. Support and encouragement provided. Will continue with POC.

## 2018-05-29 NOTE — BH Assessment (Signed)
Assessment Note  Belinda Tucker is an 42 y.o. female. Pt is walk in to [facility] due to depression, anxiety and some paranoia.  Pt reports she started psychiatric medications through her PCP, [facility] about 3 weeks ago and this has not gone well at all.  Pt has had side effects and nausea consistently over that time period despite several attempts to make adjustments.  Pt reports complete inability to function at this time and says she had to quit her job last month and has been staying with her father of late due to fear of being alone. Pt stated multiple times that she does not feel safe being alone right now. Pt denies SI/HI.  Pt denies hallucinations but does report some paranoid thought that "my husband is turning my kids against me."  (Pt divorced and kids are half time with each parent)  Pt reports no sleep over the past several days.  Pt just started seeing a new therapist, Belinda Tucker.  Pt has history of alcohol abuse and is afraid of some medications due to addictive potential.    Diagnosis: Major Depressive Disorder, Generalized Anxiety Disorder  Past Medical History:  Past Medical History:  Diagnosis Date  . Cervical dysplasia 2013  . Endometriosis   . Fibrocystic breast   . H/O thyroidectomy   . Thyroid disease    Goiters    Past Surgical History:  Procedure Laterality Date  . APPENDECTOMY    . COLPOSCOPY    . PELVIC LAPAROSCOPY  1996  . THYROIDECTOMY      Family History:  Family History  Problem Relation Age of Onset  . Hypertension Father   . Hyperlipidemia Father   . Rheum arthritis Mother     Social History:  reports that she has quit smoking. She has never used smokeless tobacco. She reports that she does not drink alcohol or use drugs.  Additional Social History:  Alcohol / Drug Use Pain Medications: Pt denies History of alcohol / drug use?: No history of alcohol / drug abuse(Pt has significant history of alcohol use but reports she has been  sober for 10 years.)  CIWA:   COWS:    Allergies:  Allergies  Allergen Reactions  . Latex Hives  . Morphine And Related Hives    Home Medications:  Medications Prior to Admission  Medication Sig Dispense Refill  . Cholecalciferol (VITAMIN D PO) Take by mouth.    . L-Lysine 1000 MG TABS Take by mouth.    . Magnesium 500 MG TABS Take by mouth.    . Multiple Vitamin (MULTIVITAMIN) capsule Take 1 capsule by mouth daily.    . Probiotic Product (PROBIOTIC PO) Take by mouth.    . thyroid (NP THYROID) 30 MG tablet TAKE 2 1/2 TABLET BY MOUTH DAILY BEFORE BREAKFAST. 75 tablet 12    OB/GYN Status:  No LMP recorded.  General Assessment Data Location of Assessment: [facility] Assessment Services TTS Assessment: In system Is this a Tele or Face-to-Face Assessment?: Face-to-Face Is this an Initial Assessment or a Re-assessment for this encounter?: Initial Assessment Patient Accompanied by:: N/A Language Other than English: No What gender do you identify as?: Female Marital status: Divorced Pregnancy Status: No Living Arrangements: Alone, Children(children part time) Can pt return to current living arrangement?: Yes Admission Status: Voluntary Is patient capable of signing voluntary admission?: Yes Referral Source: Self/Family/Friend Insurance type: self pay  Medical Screening Exam [facility] Walk-in ONLY) Medical Exam completed: Yes  Crisis Care Plan Living Arrangements:  Alone, Children(children part time) Name of Psychiatrist: none(Has been getting meds through PCP-[facility]) Name of Therapist: Edgardo Roys  Education Status Is patient currently in school?: No Is the patient employed, unemployed or receiving disability?: Unemployed  Risk to self with the past 6 months Suicidal Ideation: No Has patient been a risk to self within the past 6 months prior to admission? : No Suicidal Intent: No Has patient had any suicidal intent within the past 6 months prior to admission? :  No Is patient at risk for suicide?: No Suicidal Plan?: No Has patient had any suicidal plan within the past 6 months prior to admission? : No Access to Means: No What has been your use of drugs/alcohol within the last 12 months?: pt reports no use for past 10 years Previous Attempts/Gestures: No Other Self Harm Risks: Pt reports she does not feel safe at home currently and has been staying with her father. Intentional Self Injurious Behavior: None Family Suicide History: No Recent stressful life event(s): Other (Comment), Job Loss(severe anxiety symptoms, recent move to new apartment) Persecutory voices/beliefs?: Yes(Pt shows some paranoia, ex husband "turning my kids against ) Depression: Yes Depression Symptoms: Despondent, Insomnia, Tearfulness, Isolating, Fatigue, Guilt, Loss of interest in usual pleasures, Feeling worthless/self pity Substance abuse history and/or treatment for substance abuse?: Yes Suicide prevention information given to non-admitted patients: Not applicable  Risk to Others within the past 6 months Homicidal Ideation: No Does patient have any lifetime risk of violence toward others beyond the six months prior to admission? : No Thoughts of Harm to Others: No Current Homicidal Intent: No Current Homicidal Plan: No Access to Homicidal Means: No History of harm to others?: No Assessment of Violence: None Noted Does patient have access to weapons?: No Criminal Charges Pending?: No Does patient have a court date: No Is patient on probation?: No  Psychosis Hallucinations: None noted Delusions: Persecutory(Pt reports paranoia)  Mental Status Report Appearance/Hygiene: Disheveled Eye Contact: Good Motor Activity: Agitation, Restlessness, Hyperactivity, Unsteady Speech: Rapid, Pressured Level of Consciousness: Crying, Restless Mood: Anxious, Depressed, Helpless, Terrified Affect: Appropriate to circumstance Anxiety Level: Severe Thought Processes:  Relevant Judgement: Impaired Orientation: Person, Place, Time, Situation Obsessive Compulsive Thoughts/Behaviors: None  Cognitive Functioning Concentration: Decreased Memory: Recent Intact, Remote Intact Is patient IDD: No Insight: Fair Impulse Control: Poor Appetite: (up and down) Have you had any weight changes? : Gain Amount of the weight change? (lbs): (unsure) Sleep: Decreased Total Hours of Sleep: 0(Pt reports no sleep in several days) Vegetative Symptoms: Decreased grooming(staying with father, afraid to be home alone)  ADLScreening [facility] Assessment Services) Patient's cognitive ability adequate to safely complete daily activities?: No Patient able to express need for assistance with ADLs?: Yes Independently performs ADLs?: Yes (appropriate for developmental age)  Prior Inpatient Therapy Prior Inpatient Therapy: No  Prior Outpatient Therapy Prior Outpatient Therapy: Yes Prior Therapy Dates: current Prior Therapy Facilty/Provider(s): Belinda Tucker Reason for Treatment: anxiety/depression Does patient have an ACCT team?: No Does patient have Intensive In-House Services?  : No Does patient have [facility] services? : No Does patient have P4CC services?: No  ADL Screening (condition at time of admission) Patient's cognitive ability adequate to safely complete daily activities?: No Patient able to express need for assistance with ADLs?: Yes Independently performs ADLs?: Yes (appropriate for developmental age)                        Disposition: TTS discussed pt with Assunta Found, NP, who  recommends inpt treatment.    On Site Evaluation by:   Reviewed with Physician:    Lorri Frederick 05/29/2018 2:54 PM

## 2018-05-29 NOTE — Progress Notes (Signed)
Belinda Tucker is a 42 year old female pt admitted on voluntary basis after presenting as a walk-in. She is tearful and anxious during admission process. She reports that she has been feeling bad for the past couple months and reports that it has gotten worse the past few weeks. She reports that she is not taking care of herself and unable to function. She does report taking lexapro that was prescribed to her by [facility] who she reports is her PCP. She denies any current substance abuse issues but does report a past history of alcohol abuse. She reports that she lives alone but does share custody of her 2 children with her ex-husband. She was escorted to the unit, oriented to the milieu and safety maintained.

## 2018-05-29 NOTE — Tx Team (Signed)
Initial Treatment Plan 05/29/2018 3:42 PM Belinda Tucker ZOX:[id number]    PATIENT STRESSORS: Financial difficulties Marital or family conflict Medication change or noncompliance   PATIENT STRENGTHS: Ability for insight Average or above average intelligence Capable of independent living General fund of knowledge   PATIENT IDENTIFIED PROBLEMS: Depression Anxiety "Help me get back to myself" "I've not been taking care of myself"                     DISCHARGE CRITERIA:  Ability to meet basic life and health needs Improved stabilization in mood, thinking, and/or behavior Verbal commitment to aftercare and medication compliance  PRELIMINARY DISCHARGE PLAN: Attend aftercare/continuing care group Return to previous living arrangement  PATIENT/FAMILY INVOLVEMENT: This treatment plan has been presented to and reviewed with the patient, Belinda Tucker, and/or family member, .  The patient and family have been given the opportunity to ask questions and make suggestions.  Zeno Hickel, [place], [place] 05/29/2018, 3:42 PM

## 2018-05-29 NOTE — [facility] Group Notes (Signed)
Pt did not attend wrap up group this evening. Pt stayed in their room instead 

## 2018-05-30 DIAGNOSIS — F332 Major depressive disorder, recurrent severe without psychotic features: Principal | ICD-10-CM

## 2018-05-30 MED ORDER — LORAZEPAM 0.5 MG PO TABS
0.5000 mg | ORAL_TABLET | Freq: Once | ORAL | Status: AC
  Administered 2018-05-30: 0.5 mg via ORAL
  Filled 2018-05-30: qty 1

## 2018-05-30 MED ORDER — MIRTAZAPINE 7.5 MG PO TABS
7.5000 mg | ORAL_TABLET | Freq: Every day | ORAL | Status: DC
  Administered 2018-05-31: 7.5 mg via ORAL
  Filled 2018-05-30 (×5): qty 1

## 2018-05-30 MED ORDER — ESCITALOPRAM OXALATE 5 MG PO TABS
2.5000 mg | ORAL_TABLET | Freq: Every day | ORAL | Status: DC
  Filled 2018-05-30 (×2): qty 1

## 2018-05-30 MED ORDER — MELATONIN 3 MG PO TABS
3.0000 mg | ORAL_TABLET | Freq: Every evening | ORAL | Status: DC | PRN
  Administered 2018-05-30: 3 mg via ORAL
  Filled 2018-05-30 (×3): qty 1

## 2018-05-30 MED ORDER — QUETIAPINE 12.5 MG HALF TABLET
12.5000 mg | ORAL_TABLET | Freq: Two times a day (BID) | ORAL | Status: DC
  Administered 2018-05-30: 12.5 mg via ORAL
  Filled 2018-05-30 (×6): qty 1

## 2018-05-30 MED ORDER — ESCITALOPRAM OXALATE 5 MG PO TABS
2.5000 mg | ORAL_TABLET | Freq: Every day | ORAL | Status: DC
  Administered 2018-05-31: 2.5 mg via ORAL
  Filled 2018-05-30 (×3): qty 1

## 2018-05-30 NOTE — Progress Notes (Signed)
Pt has been argumentative/demanding to Clinical research associate over thyroid medication. Pt states she takes 2 1/2 tablets of NP thyroid. Provider on call was notified multiple times regarding thyroid medication last night. See order. Pt states "I do not take that; It's not the same. Why can't I just take mine?". Per provider, writer explained to Pt that pharmacist has to review formulary first. Pt remains irritable/agitated at this time. Pt states "My body might react if I take this". Pt refused Armour 75 and was encourage to speak with provider this a.m.

## 2018-05-30 NOTE — [facility] Group Notes (Signed)
[facility] Group Therapy 05/30/2018 1:15pm  Type of Therapy: Mental [facility] Presentation  Participation Level: Invited. Chose to remain in bed.   Eldor Conaway S Dorenda Pfannenstiel, LCSW 05/30/2018 11:01 AM  

## 2018-05-30 NOTE — Tx Team (Signed)
Interdisciplinary Treatment and Diagnostic Plan Update  05/30/2018 Time of Session: [place] MRN: [id number]  Principal Diagnosis: <principal problem not specified>  Secondary Diagnoses: Active Problems:   MDD (major depressive disorder), recurrent episode, severe (HCC)   MDD (major depressive disorder), severe (HCC)   Current Medications:  Current Facility-Administered Medications  Medication Dose Route Frequency Provider Last Rate Last Dose  . acetaminophen (TYLENOL) tablet 650 mg  650 mg Oral Q6H PRN Lindon Romp A, NP   650 mg at 05/29/18 2330  . [START ON 05/31/2018] escitalopram (LEXAPRO) tablet 2.5 mg  2.5 mg Oral Daily Cobos, Fernando A, MD      . mirtazapine (REMERON) tablet 7.5 mg  7.5 mg Oral QHS Cobos, Myer Peer, MD      . thyroid (ARMOUR) tablet 75 mg  75 mg Oral QAC breakfast Lindon Romp A, NP   75 mg at 05/30/18 0818   PTA Medications: Medications Prior to Admission  Medication Sig Dispense Refill Last Dose  . escitalopram (LEXAPRO) 5 MG tablet Take 2.5 mg by mouth daily.   0 Past Week at Unknown time  . Melatonin 3 MG TABS Take 3 mg by mouth at bedtime as needed (sleep).     . ondansetron (ZOFRAN-ODT) 4 MG disintegrating tablet Take 4 mg by mouth every 8 (eight) hours as needed for nausea or vomiting.   Past Month at Unknown time  . Magnesium 500 MG TABS Take by mouth.   Taking  . thyroid (NP THYROID) 30 MG tablet TAKE 2 1/2 TABLET BY MOUTH DAILY BEFORE BREAKFAST. 75 tablet 12     Patient Stressors: Financial difficulties Marital or family conflict Medication change or noncompliance  Patient Strengths: Ability for insight Average or above average intelligence Capable of independent living General fund of knowledge  Treatment Modalities: Medication Management, Group therapy, Case management,  1 to 1 session with clinician, Psychoeducation, Recreational therapy.   Physician Treatment Plan for Primary Diagnosis: <principal problem not  specified> Long Term Goal(s): Improvement in symptoms so as ready for discharge Improvement in symptoms so as ready for discharge   Short Term Goals: Ability to identify changes in lifestyle to reduce recurrence of condition will improve Ability to maintain clinical measurements within normal limits will improve Ability to identify changes in lifestyle to reduce recurrence of condition will improve Ability to verbalize feelings will improve Ability to disclose and discuss suicidal ideas Ability to demonstrate self-control will improve Ability to identify and develop effective coping behaviors will improve Ability to maintain clinical measurements within normal limits will improve  Medication Management: Evaluate patient's response, side effects, and tolerance of medication regimen.  Therapeutic Interventions: 1 to 1 sessions, Unit Group sessions and Medication administration.  Evaluation of Outcomes: Not Met  Physician Treatment Plan for Secondary Diagnosis: Active Problems:   MDD (major depressive disorder), recurrent episode, severe (HCC)   MDD (major depressive disorder), severe ([place])  Long Term Goal(s): Improvement in symptoms so as ready for discharge Improvement in symptoms so as ready for discharge   Short Term Goals: Ability to identify changes in lifestyle to reduce recurrence of condition will improve Ability to maintain clinical measurements within normal limits will improve Ability to identify changes in lifestyle to reduce recurrence of condition will improve Ability to verbalize feelings will improve Ability to disclose and discuss suicidal ideas Ability to demonstrate self-control will improve Ability to identify and develop effective coping behaviors will improve Ability to maintain clinical measurements within normal limits will improve  Medication Management: Evaluate patient's response, side effects, and tolerance of medication regimen.  Therapeutic  Interventions: 1 to 1 sessions, Unit Group sessions and Medication administration.  Evaluation of Outcomes: Not Met   RN Treatment Plan for Primary Diagnosis: <principal problem not specified> Long Term Goal(s): Knowledge of disease and therapeutic regimen to maintain health will improve  Short Term Goals: Ability to identify and develop effective coping behaviors will improve and Compliance with prescribed medications will improve  Medication Management: RN will administer medications as ordered by provider, will assess and evaluate patient's response and provide education to patient for prescribed medication. RN will report any adverse and/or side effects to prescribing provider.  Therapeutic Interventions: 1 on 1 counseling sessions, Psychoeducation, Medication administration, Evaluate responses to treatment, Monitor vital signs and CBGs as ordered, Perform/monitor CIWA, COWS, AIMS and Fall Risk screenings as ordered, Perform wound care treatments as ordered.  Evaluation of Outcomes: Not Met   LCSW Treatment Plan for Primary Diagnosis: <principal problem not specified> Long Term Goal(s): Safe transition to appropriate next level of care at discharge, Engage patient in therapeutic group addressing interpersonal concerns.  Short Term Goals: Engage patient in aftercare planning with referrals and resources, Increase social support and Increase skills for wellness and recovery  Therapeutic Interventions: Assess for all discharge needs, 1 to 1 time with Social worker, Explore available resources and support systems, Assess for adequacy in community support network, Educate family and significant other(s) on suicide prevention, Complete Psychosocial Assessment, Interpersonal group therapy.  Evaluation of Outcomes: Not Met   Progress in Treatment: Attending groups: No. Participating in groups: No. Taking medication as prescribed: Yes. Toleration medication: Yes. Family/Significant other  contact made: No, will contact:  when given permission Patient understands diagnosis: Yes. Discussing patient identified problems/goals with staff: Yes. Medical problems stabilized or resolved: Yes. Denies suicidal/homicidal ideation: Yes. Issues/concerns per patient self-inventory: No. Other: none  New problem(s) identified: No, Describe:  none  New Short Term/Long Term Goal(s):  Patient Goals:  "get meds straight so my emotions are better and improve sleep"  Discharge Plan or Barriers:   Reason for Continuation of Hospitalization: Anxiety Depression Medication stabilization  Estimated Length of Stay: 3-5 days   Attendees: Patient:Courtlyn Palazzi 05/30/2018   Physician: Larene Beach, MD 05/30/2018   Nursing: Grayland Ormond, RN 05/30/2018   RN Care Manager: 05/30/2018   Social Worker: Lurline Idol, LCSW 05/30/2018   Recreational Therapist:  05/30/2018   Other:  05/30/2018   Other:  05/30/2018   Other: 05/30/2018       Scribe for Treatment Team: Joanne Chars, [place] 05/30/2018 3:21 PM
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
## 2018-05-30 NOTE — Progress Notes (Signed)
Recreation Therapy Notes  Date: 10.16.19 Time: 0930 Location: [address] Dayroom  Group Topic: Stress Management  Goal Area(s) Addresses:  Patient will verbalize importance of using healthy stress management.  Patient will identify positive emotions associated with healthy stress management.   Intervention: Stress Management  Activity :  Meditation.  LRT introduced the stress management technique of meditation.  LRT played a meditation that dealt with resilience.  Patients were to listen and follow along as meditation played.  Education:  Stress Management, Discharge Planning.   Education Outcome: Acknowledges edcuation/In group clarification offered/Needs additional education  Clinical Observations/Feedback: Pt did not attend group.    Selim Durden, LRT/CTRS         Avi Kerschner A 05/30/2018 11:31 AM 
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
## 2018-05-30 NOTE — Progress Notes (Signed)
Pt observed laying in bed with eyes open. Pt has not been able to sleep. Pt appears to be working herself up; restless and racing thoughts.HR/BP elevated; Pt was given Gatorade and was encourage to push fluids. Pt encourage to deep breathe. Provider on call notified. PRN melatonin was given per Pt request. Pt states she is hesitant about taking certain medications due to being "hypersensitive". Will continue to monitor.

## 2018-05-30 NOTE — Progress Notes (Signed)
Patient stated she needs to sleep, cannot lay down and sleep.  Stated she wants to take sleep medicine now at 3:00 p.m.  Melatonin is not ordered for her.  Stated she has discussed her meds with MD.  Patient has just talked to her sister on the phone and has been crying. Safety maintained with 15 minute checks.

## 2018-05-30 NOTE — Progress Notes (Signed)
D:  Patient's self inventory sheet, patient has poor sleep, sleep medicine melatonin 3 mg.  Fair appetite, low energy level, poor concentration.  Rated depression 8, denied hopeless, anxiety 10.  Denied withdrawals, then checked tremors, chilling, agitation, nausea.  "I need sleep, I have not slept in 2 days.  I need rest emotionally and physically."   Denied physical pain.  Denied pain medicine.  Goal is to sleep and not cry and get my heart down.  Sleep and take meds & read my Bible."  Needs thyroid 75 mg.  No discharge plans. A:  Medications administered per MD orders.  Emotional support and encouragement given patient. R:  Denied SI and HI, contracts for safety.  Denied A/V hallucinations.  Safety maintained with 15 minute checks.

## 2018-05-30 NOTE — Plan of Care (Signed)
Nurse discussed anxiety, depression and coping skills with patient.  

## 2018-05-30 NOTE — Progress Notes (Signed)
Patient stated she wanted the MD called and see the MD that her BP is rising.  MD called.  One time order for ativan 0.5 mg p.o. Per MD.  Patient can refuse the ativan if she chooses.  Do not given patient any more seroquel.  Patient can be offered remeron tonight for sleep.  PA tonight to talk to patient briefly.

## 2018-05-30 NOTE — Progress Notes (Addendum)
Pt was observed resting in bed with eyes open. Pt did not attend wrap-up group.Pt appears irritable/depressed/anxious/labile in affect and mood. Pt denies SI/HI/AVH at this time. Pt states she had a reaction to Seroquel on dayshift. Pt states she hopes to get some sleep with the 1X ativan. Pt remains somatically focused. Pt states "I just want to be able to sleep so I can go home and see my kids". Support and encouragement provided. Will continue with POC.
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
## 2018-05-30 NOTE — [facility] Suicide Risk Assessment (Signed)
[facility] INPATIENT:  Family/Significant Other Suicide Prevention Education  Suicide Prevention Education:  Education Completed; NO ONE has been identified by the patient as the family member/significant other with whom the patient will be residing, and identified as the person(s) who will aid the patient in the event of a mental health crisis (suicidal ideations/suicide attempt).  With written consent from the patient, the family member/significant other has been provided the following suicide prevention education, prior to the and/or following the discharge of the patient.  The suicide prevention education provided includes the following:  Suicide risk factors  Suicide prevention and interventions  National Suicide Hotline telephone number  [facility] assessment telephone number  [facility] Emergency Assistance 911  [facility] and/or Residential Mobile Crisis Unit telephone number   Patient did endorse suicidal ideation during or prior to this hospitalization. SPE not required. Patient reports she does not want CSW to contact her mother or father at this time.   Belinda Tucker 05/30/2018, 9:45 AM

## 2018-05-31 MED ORDER — ESCITALOPRAM OXALATE 5 MG PO TABS
5.0000 mg | ORAL_TABLET | Freq: Every day | ORAL | Status: DC
  Administered 2018-06-01 – 2018-06-04 (×4): 5 mg via ORAL
  Filled 2018-05-31 (×5): qty 1
  Filled 2018-05-31: qty 7
  Filled 2018-05-31: qty 1

## 2018-05-31 MED ORDER — LORAZEPAM 0.5 MG PO TABS
0.5000 mg | ORAL_TABLET | Freq: Four times a day (QID) | ORAL | Status: DC | PRN
  Filled 2018-05-31: qty 1

## 2018-05-31 MED ORDER — ONDANSETRON HCL 4 MG PO TABS: 4.0000 mg | ORAL_TABLET | Freq: Three times a day (TID) | ORAL | Status: DC | PRN

## 2018-05-31 NOTE — Progress Notes (Signed)
Adult Psychoeducational Group Note  Date:  05/31/2018 Time:  10:29 PM  Group Topic/Focus:  Wrap-Up Group:   The focus of this group is to help patients review their daily goal of treatment and discuss progress on daily workbooks.  Participation Level:  Active  Participation Quality:  Appropriate  Affect:  Appropriate  Cognitive:  Appropriate  Insight: Appropriate  Engagement in Group:  Engaged  Modes of Intervention:  Discussion  Additional Comments:  Pt's goal was to be happy and calm today.  Pt got out of bed, ate her meals and was not as emotional as she was the day before.  Pt rated the day at a 7/10.  Dalyla Chui 05/31/2018, 10:29 PM

## 2018-05-31 NOTE — Progress Notes (Signed)
   05/31/18 0500  Sleep  Number of Hours 6.75   Ativan 0.5 at bedtime ordered 1x.

## 2018-05-31 NOTE — Progress Notes (Signed)
Patient ID: Belinda Tucker, female   DOB: April 14, 1976, 42 y.o.   MRN: [id number]  Nursing Progress Note [id number]  Data: Patient presents anxious on approach and is very preoccupied/fixated on her medications and doses. Patient approached writer asking about her Lexapro dose and stated, "is it 5 mg? I can't take it if it's 5 mg". Patient informed current dose is 2.5mg . Patient agreeable to taking the dose. Patient then stated, "I need to talk to the doctor and tell him I could go up to 5 mg now". Patient also requesting to have her Remeron scheduled for 1900. Patient informed sleep medications cannot be given until 2100. Patient verbalized understanding. Patient refused her Seroquel orders stating, "that medication made me bug out, I felt awful last night". Patient with pressured speech and anxious mood. Patient denies pain/physical complaints. Patient completed self-inventory sheet and rates depression, hopelessness, and anxiety 5,0,0 respectively. Patient rates their sleep and appetite as good/good respectively. Patient states goal for today is to "sleep, rest and feel happy and calm". Patient is seen attending groups and visible in the milieu. Patient currently denies SI/HI/AVH.   Action: Patient is educated about and provided medication per provider's orders. Patient safety maintained with q15 min safety checks and frequent rounding. Low fall risk precautions in place. Emotional support given. 1:1 interaction and active listening provided. Patient encouraged to attend meals, groups, and work on treatment plan and goals. Labs, vital signs and patient behavior monitored throughout shift.   Response: Patient remains safe on the unit at this time and agrees to come to staff with any issues/concerns. Patient is interacting with peers appropriately on the unit. Will continue to support and monitor.
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
## 2018-05-31 NOTE — Progress Notes (Signed)
Pt was observed in the dayroom, attending wrap-up group.Pt appears depressed/anxious/labile in affect and mood. Pt denies SI/HI/AVH atthis time. Pt remains somatically  focused and preoccupied about BP/HR. Vitals was stable this evening. Pt encourage to push fluids.Support and encouragement provided. Will continue with POC.

## 2018-05-31 NOTE — Progress Notes (Addendum)
[facility] MD Progress Note  05/31/2018 11:41 AM Belinda Tucker  MRN:  [id number] Subjective: Patient reports persistent anxiety, remains ruminative about medications and possible side effects.  However, does state that she is feeling "a little bit better" today, and states she slept better last night. Regarding medications states she did not tolerate Seroquel well due to subjective restlessness and palpitations.  She does state that she seems to be tolerating Lexapro well and states "I think I can go up on the dose".  Denies suicidal ideations. Objective : I have discussed case with treatment team and have met with patient. 42 year old female, presented to the hospital voluntarily due to worsening depression and anxiety which she describes as debilitating.  Reports neurovegetative symptoms and in particular poor sleep/insomnia.  Denies suicidal ideations.  She reports that being away from her children, who are currently with her father, has been a significant stressor. She reports a historical preference for natural supplements and treatment and acknowledges apprehension and concern regarding medications and possible side effects.  Prior to admission had been started on Lexapro which she has been taking at 2.5 mg daily due to concerns of potential side effects.   Today patient presents with partial improvement, less severely anxious, but still ruminative and worried. Continues to express concern regarding potential side effects-states Seroquel( 12.5 mgr dose) was poorly tolerated due to palpitations and restlessness .  She states Ativan PRN did help to decrease anxiety and improve sleep but expresses concern about abuse /addictive properties.  She does state that she feels Lexapro is well-tolerated at this time and is in agreement to titrate dose gradually(currently taking at 2.5 mg daily). Denies suicidal or self-injurious ideations. On unit has been noted to be anxious, focused on medications/side  effects, but no disruptive or agitated behaviors.  Today presents anxious but to a lesser degree than on admission, and seems more responsive to support and reassurance.  Principal Problem:  MDD Diagnosis:   Patient Active Problem List   Diagnosis Date Noted  . MDD (major depressive disorder), recurrent episode, severe ([place]) [F33.2] 05/29/2018  . MDD (major depressive disorder), severe ([place]) [F32.2] 05/29/2018   Total Time spent with patient: 20 minutes  Past Psychiatric History:   Past Medical History:  Past Medical History:  Diagnosis Date  . Cervical dysplasia 2013  . Endometriosis   . Fibrocystic breast   . H/O thyroidectomy   . Thyroid disease    [place]    Past Surgical History:  Procedure Laterality Date  . APPENDECTOMY    . COLPOSCOPY    . PELVIC LAPAROSCOPY  1996  . THYROIDECTOMY     Family History:  Family History  Problem Relation Age of Onset  . Hypertension Father   . Hyperlipidemia Father   . Rheum arthritis Mother    Family Psychiatric  History:  Social History:  Social History   Substance and Sexual Activity  Alcohol Use No  . Alcohol/week: 0.0 standard drinks     Social History   Substance and Sexual Activity  Drug Use No    Social History   Socioeconomic History  . Marital status: Divorced    Spouse name: Not on file  . Number of children: Not on file  . Years of education: Not on file  . Highest education level: Not on file  Occupational History  . Not on file  Social Needs  . Financial resource strain: Not on file  . Food insecurity:    Worry: Not on file  Inability: Not on file  . Transportation needs:    Medical: Not on file    Non-medical: Not on file  Tobacco Use  . Smoking status: Former Research scientist (life sciences)  . Smokeless tobacco: Never Used  Substance and Sexual Activity  . Alcohol use: No    Alcohol/week: 0.0 standard drinks  . Drug use: No  . Sexual activity: Yes    Birth control/protection: Condom    Comment: 1st intercourse  42 yo-More than 5 partners  Lifestyle  . Physical activity:    Days per week: Not on file    Minutes per session: Not on file  . Stress: Not on file  Relationships  . Social connections:    Talks on phone: Not on file    Gets together: Not on file    Attends religious service: Not on file    Active member of club or organization: Not on file    Attends meetings of clubs or organizations: Not on file    Relationship status: Not on file  Other Topics Concern  . Not on file  Social History Narrative  . 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 acetaminophen (TYLENOL) tablet 650 mg  650 mg Oral Q6H PRN Lindon Romp A, NP   650 mg at 05/29/18 2330  . escitalopram (LEXAPRO) tablet 2.5 mg  2.5 mg Oral Daily Hansen Carino, Myer Peer, MD   2.5 mg at 05/31/18 0807  . mirtazapine (REMERON) tablet 7.5 mg  7.5 mg Oral QHS Phillippe Orlick A, MD      . QUEtiapine (SEROQUEL) tablet 12.5 mg  12.5 mg Oral BID Claudie Rathbone, Myer Peer, MD   12.5 mg at 05/30/18 1617  . thyroid (ARMOUR) tablet 75 mg  75 mg Oral QAC breakfast Rozetta Nunnery, NP   75 mg at 05/31/18 0712    Lab Results:  Results for orders placed or performed during the hospital encounter of 05/29/18 (from the past 48 hour(s))  Urinalysis, Complete w Microscopic     Status: Abnormal   Collection Time: 05/29/18  4:48 PM  Result Value Ref Range   Color, Urine YELLOW YELLOW   APPearance TURBID (A) CLEAR   Specific Gravity, Urine 1.023 1.005 - 1.030   pH 5.0 5.0 - 8.0   Glucose, UA 50 (A) NEGATIVE mg/dL   Hgb urine dipstick LARGE (A) NEGATIVE   Bilirubin Urine NEGATIVE NEGATIVE   Ketones, ur  NEGATIVE NEGATIVE mg/dL   Protein, ur NEGATIVE NEGATIVE mg/dL   Nitrite NEGATIVE NEGATIVE   Leukocytes, UA NEGATIVE NEGATIVE   Bacteria, UA NONE SEEN NONE SEEN   Squamous Epithelial / LPF 0-5 0 - 5   Mucus PRESENT    Amorphous Crystal PRESENT    Ca Oxalate Crys, UA PRESENT     Comment: Performed at [facility], [place] [address], [place], [place] [address]  Pregnancy, urine     Status: None   Collection Time: 05/29/18  4:48 PM  Result Value Ref Range   Preg Test, Ur NEGATIVE NEGATIVE    Comment:        THE SENSITIVITY OF THIS METHODOLOGY IS >20 mIU/mL. Performed at [facility], [place] [address] [place], [place] [address]   Urine rapid drug screen (hosp performed)not at [facility]     Status: None   Collection Time: 05/29/18  4:48 PM  Result Value Ref Range   Opiates NONE DETECTED NONE DETECTED   Cocaine NONE DETECTED NONE DETECTED   Benzodiazepines NONE DETECTED NONE DETECTED   Amphetamines NONE DETECTED NONE DETECTED   Tetrahydrocannabinol NONE DETECTED NONE DETECTED   Barbiturates NONE DETECTED NONE DETECTED    Comment: (NOTE) DRUG SCREEN FOR MEDICAL PURPOSES ONLY.  IF CONFIRMATION IS NEEDED FOR ANY PURPOSE, NOTIFY LAB WITHIN 5 DAYS. LOWEST DETECTABLE LIMITS FOR URINE DRUG SCREEN Drug Class                     Cutoff (ng/mL) Amphetamine and metabolites    1000 Barbiturate and metabolites    200 Benzodiazepine                 370 Tricyclics and metabolites     300 Opiates and metabolites        300 Cocaine and metabolites        300 THC                            50 Performed at [facility], [place] [address], [place], [place] [address]   CBC     Status: None   Collection Time: 05/29/18  6:14 PM  Result Value Ref Range   WBC 8.8 4.0 - 10.5 K/uL   RBC 4.45 3.87 - 5.11 MIL/uL   Hemoglobin 14.2 12.0 - 15.0 g/dL   HCT 41.5 36.0 - 46.0 %   MCV 93.3 80.0 - 100.0 fL   MCH 31.9 26.0 - 34.0 pg   MCHC 34.2 30.0 - 36.0 g/dL    RDW 12.3 11.5 - 15.5 %   Platelets 285 150 - 400 K/uL   nRBC 0.0 0.0 - 0.2 %    Comment: Performed at [facility], [place] [address] [place], [place] [address]  Comprehensive metabolic panel     Status: Abnormal   Collection Time: 05/29/18  6:14 PM  Result Value Ref Range   Sodium 141 135 - 145 mmol/L   Potassium 3.8 3.5 - 5.1 mmol/L   Chloride 107 98 - 111 mmol/L   CO2 25 22 - 32 mmol/L   Glucose, Bld 143 (H) 70 - 99 mg/dL   BUN 15 6 - 20 mg/dL   Creatinine, Ser 0.85 0.44 - 1.00 mg/dL   Calcium 9.6 8.9 - 10.3 mg/dL   Total Protein 6.8 6.5 - 8.1 g/dL   Albumin 4.1 3.5 - 5.0 g/dL   AST 14 (L) 15 - 41 U/L   ALT 11 0 - 44 U/L   Alkaline Phosphatase 33 (L) 38 - 126 U/L   Total Bilirubin 1.8 (H) 0.3 - 1.2 mg/dL   GFR calc non Af Amer >60 >60 mL/min   GFR calc Af Amer >60 >60 mL/min    Comment: (NOTE) The eGFR has been calculated using the CKD EPI equation. This calculation has not been validated in all clinical situations. eGFR's persistently <60 mL/min signify possible Chronic Kidney Disease.    Anion gap 9 5 - 15    Comment: Performed at [facility], [place] [address], [place], [place] [address]  Hemoglobin A1c     Status: None   Collection Time: 05/29/18  6:14 PM  Result Value Ref Range   Hgb A1c MFr Bld 5.1 4.8 - 5.6 %    Comment: (NOTE) Pre diabetes:          5.7%-6.4% Diabetes:              >6.4% Glycemic control for   <7.0% adults with diabetes    Mean Plasma Glucose 99.67 mg/dL    Comment: Performed at [place] [address], [place], [place] [address]  Ethanol     Status: None   Collection Time: 05/29/18  6:14 PM  Result Value Ref Range   Alcohol, Ethyl (B) <10 <10 mg/dL    Comment: (NOTE) Lowest detectable limit for serum alcohol is 10 mg/dL. For medical purposes only. Performed at [facility], [place] [address] [place], [place] [address]   Lipid panel     Status: None   Collection Time:  05/29/18  6:14 PM  Result Value Ref Range   Cholesterol 173 0 - 200 mg/dL   Triglycerides 82 <150 mg/dL   HDL 71 >40 mg/dL   Total CHOL/HDL Ratio 2.4 RATIO   VLDL 16 0 - 40 mg/dL   LDL Cholesterol 86 0 - 99 mg/dL    Comment:        Total Cholesterol/HDL:CHD Risk Coronary Heart Disease Risk Table                     Men   Women  1/2 Average Risk   3.4   3.3  Average Risk       5.0   4.4  2 X Average Risk   9.6   7.1  3 X Average Risk  23.4   11.0        Use the calculated Patient Ratio above and the CHD Risk Table to determine the patient's CHD Risk.        ATP III CLASSIFICATION (LDL):  <100     mg/dL   Optimal  100-129  mg/dL   Near or Above                    Optimal  130-159  mg/dL   Borderline  160-189  mg/dL   High  >190     mg/dL   Very High Performed at [place] [address] [place], [place] [address]   TSH     Status: None   Collection Time: 05/29/18  6:14 PM  Result Value Ref Range   TSH 1.802 0.350 - 4.500 uIU/mL    Comment: Performed by a 3rd Generation assay with a functional sensitivity of <=0.01 uIU/mL. Performed at [facility], [place] [address], [place], [place] [address]     Blood Alcohol level:  Lab Results  Component Value Date   ETH <10 84/66/5993    Metabolic Disorder Labs: Lab Results  Component Value Date   HGBA1C 5.1 05/29/2018   MPG 99.67 05/29/2018   No results found for: PROLACTIN Lab Results  Component Value Date   CHOL 173 05/29/2018   TRIG 82 05/29/2018   HDL 71 05/29/2018   CHOLHDL 2.4 05/29/2018   VLDL 16 05/29/2018   LDLCALC 86 05/29/2018   LDLCALC 64 01/25/2018    Physical Findings: AIMS: Facial and Oral Movements Muscles of Facial Expression: None, normal Lips and Perioral Area: None, normal Jaw: None, normal Tongue: None, normal,Extremity Movements Upper (arms, wrists, hands, fingers): None, normal Lower (legs, knees, ankles, toes): None, normal,  Trunk Movements Neck,  shoulders, hips: None, normal, Overall Severity Severity of abnormal movements (highest score from questions above): None, normal Incapacitation due to abnormal movements: None, normal Patient's awareness of abnormal movements (rate only patient's report): No Awareness, Dental Status Current problems with teeth and/or dentures?: No Does patient usually wear dentures?: No  CIWA:  CIWA-Ar Total: 4 COWS:  COWS Total Score: 2  Musculoskeletal: Strength & Muscle Tone: within normal limits Gait & Station: normal Patient leans: N/A  Psychiatric Specialty Exam: Physical Exam  ROS reports restlessness and palpitations yesterday which she attributes to side effects from Seroquel.  Today denies chest pain or shortness of breath, endorses nausea, no vomiting  Blood pressure (!) 123/92, pulse 88, temperature 98.8 F (37.1 C), temperature source Oral, resp. rate 20, height '5\' 6"'  (1.676 m), weight 55.3 kg.Body mass index is 19.69 kg/m.  General Appearance: Well Groomed  Eye Contact:  Good  Speech:  Normal Rate  Volume:  Normal  Mood:  Partially improved mood, remains anxious, presents less depressed today  Affect:  Anxious but less severely than yesterday  Thought Process:  Linear and Descriptions of Associations: Intact  Orientation:  Other:  Fully alert and attentive  Thought Content:  Denies hallucinations, no delusions, ruminative/focused on medication concerns  Suicidal Thoughts:  No-denies suicidal ideations, no homicidal or violent ideations  Homicidal Thoughts:  No  Memory:  Recent and remote grossly intact  Judgement:  Fair  Insight:  Fair  Psychomotor Activity:  No psychomotor agitation at this time   Concentration:  Concentration: Good and Attention Span: Good  Recall:  Good  Fund of Knowledge:  Good  Language:  Good  Akathisia:  Negative  Handed:  Right  AIMS (if indicated):     Assets:  Communication Skills Desire for Improvement Physical Health Resilience  ADL's:  Intact   Cognition:  WNL  Sleep:  Number of Hours: 6.75   Assessment- 42 year old female, presented to the hospital voluntarily due to worsening depression and anxiety which she describes as debilitating.  Reports neurovegetative symptoms and in particular poor sleep/insomnia.  Denies suicidal ideations.  She reports that being away from her children, who are currently with her father, has been a significant stressor. She reports a historical preference for natural supplements and treatment and acknowledges apprehension and concern regarding medications and possible side effects.  Prior to admission had been started on Lexapro which she has been taking at 2.5 mg daily due to concerns of potential side effects.  Patient reports some improvement compared to admission although remains anxious and ruminative.  Today presents with a more reactive affect and less significantly/severely anxious.  Continues to focus on medication concerns and possible side effects, states she did not tolerate low-dose Seroquel well due to palpitations and subjective agitation but states that Lexapro seems well-tolerated and is interested in proceeding with gradual titration.  Ativan PRN's have been helpful to decrease anxiety, agrees to inpatient/ short-term PRN Ativan management to address anxiety if needed .   Treatment Plan Summary: Daily contact with patient to assess and evaluate symptoms and progress in treatment, Medication management, Plan Inpatient treatment and Medications as below Encourage group and milieu participation to work on coping skills and symptom reduction Treatment team working on disposition planning Increase Lexapro to 5 mg daily for depression and anxiety Continue Remeron 7.5 mg nightly for depression, anxiety, insomnia Continue Ativan 0.5 mg every 6 hours PRN for anxiety as needed Discontinue Seroquel Requests Zofran PRNs for nausea, states she tolerates  this medication well , denies side  effects  Jenne Campus, MD 05/31/2018, 11:41 AM

## 2018-06-01 MED ORDER — MIRTAZAPINE 7.5 MG PO TABS
7.5000 mg | ORAL_TABLET | Freq: Every day | ORAL | Status: DC
  Administered 2018-06-01 – 2018-06-02 (×2): 7.5 mg via ORAL
  Filled 2018-06-01 (×4): qty 1

## 2018-06-01 NOTE — Progress Notes (Signed)
Patient ID: Belinda Tucker, female   DOB: 01/09/1976, 41 y.o.   MRN: [id number]  Nursing Progress Note [id number]  Data: Patient presents highly anxious and tearful this morning. Patient came to writer for her morning medications and reported she was unsure if she should take the 5mg  of Lexapro due to taking Remeron last night. Writer provided education about her medications and was encouraged to take the Lexapro. Patient was ambivilant and grew tearful expressing concerns about her heart rate and blood pressure. Patient also stated she missed her kids and did not want to be hospitalized longer. Patient agreed to take her Lexapro and requested staff monitor her blood pressure and heart rate throughout the day. Patient completed self-inventory sheet and rates depression, hopelessness, and anxiety 5,2,5 respectively. Patient rates their sleep and appetite as fair/good respectively. Patient states goal for today is to "feel happy, have hope, and speak positive affirmations only". Patient is seen visible in the milieu. Patient currently denies SI/HI/AVH.   Patient requesting to take her Remeron at 55. Will notify MD.  Action: Patient is educated about and provided medication per provider's orders. Patient safety maintained with q15 min safety checks and frequent rounding. Low fall risk precautions in place. Emotional support given. 1:1 interaction and active listening provided. Patient encouraged to attend meals, groups, and work on treatment plan and goals. Labs, vital signs and patient behavior monitored throughout shift.   Response: Patient remains safe on the unit at this time and agrees to come to staff with any issues/concerns. Patient is interacting with peers appropriately on the unit. Will continue to support and monitor.

## 2018-06-01 NOTE — Progress Notes (Signed)
Recreation Therapy Notes  Date: 10.18.19 Time: 0930 Location: [address] Day[address]  Group Topic: Stress Management  Goal Area(s) Addresses:  Patient will verbalize importance of using healthy stress management.  Patient will identify positive emotions associated with healthy stress management.   Intervention: Stress Management  Activity : Progressive Muscle Relaxation.  LRT introduced the stress management technique of progressive muscle relaxation.  LRT read a script to guide patients in tensing each muscle group individually then relaxing them. Patients were to follow along as the script was read.  Education:  Stress Management, Discharge Planning.   Education Outcome: Acknowledges edcuation/In group clarification offered/Needs additional education  Clinical Observations/Feedback: Pt did not attend group.    Caroll Rancher, LRT/CTRS         Lillia Abed, Daejah Klebba A 06/01/2018 11:16 AM

## 2018-06-01 NOTE — Progress Notes (Signed)
Writer spoke with patient 1:1 and she reports having had a better day. She reports that she was able to speak to her son on tonight and she was really happy about that. She reports that she is adjusting to taking medications and is not used to having to get up so early. She attended group and appears a little anxious but pleasant. Support given and safety maintained on unit with 15 min checks.

## 2018-06-01 NOTE — Plan of Care (Signed)
  Problem: Education: Goal: Emotional status will improve Outcome: Progressing  Patient appears less anxious throughout the day and is not tearful anymore.   Problem: Activity: Goal: Interest or engagement in activities will improve Outcome: Progressing  Patient is seen in the milieu and attending groups.   Problem: Safety: Goal: Periods of time without injury will increase Outcome: Progressing  Patient is on q15 minute safety checks and contracts for safety with staff.

## 2018-06-01 NOTE — Progress Notes (Signed)
[facility] MD Progress Note  06/01/2018 5:50 PM Belinda Tucker  MRN:  [id number] Subjective: Patient reports some improvement.  Acknowledges she is feeling less significantly/severely anxious.  States "I feel more like myself".  She reports some persistent depression, but states she feels her mood is starting to improve.  Remains anxious and concerned about possible medication side effects but to a lesser degree and acknowledges that she seems to be tolerating medications "better" and seems less concerned about these issues today Objective : I have discussed case with treatment team and have met with patient. 42 year old female, presented to the hospital voluntarily due to worsening depression and anxiety which she describes as debilitating.  Reports neurovegetative symptoms and in particular poor sleep/insomnia.  Denies suicidal ideations.  She reports that being away from her children, who are currently with her father, has been a significant stressor. She reports a historical preference for natural supplements and treatment and acknowledges apprehension and concern regarding medications and possible side effects.  Prior to admission had been started on Lexapro which she has been taking at 2.5 mg daily due to concerns of potential side effects.   Patient presents with noticeable improvement.  Presents less anxious, less ruminative, less focused on concerns regarding medication side effects.  She states she is tolerating current medication regimen well and wants to continue it, although prefers to take Remeron earlier in the evening. She reports some persistent depression but acknowledges gradually improving mood.  Denies suicidal ideations. Nursing staff reports that patient presents less anxious, with improved range of affect She is visible on unit, interacting appropriately with peers.   Principal Problem:  MDD Diagnosis:   Patient Active Problem List   Diagnosis Date Noted  . MDD (major depressive  disorder), recurrent episode, severe ([place]) [F33.2] 05/29/2018  . MDD (major depressive disorder), severe ([place]) [F32.2] 05/29/2018   Total Time spent with patient: 20 minutes  Past Psychiatric History:   Past Medical History:  Past Medical History:  Diagnosis Date  . Cervical dysplasia 2013  . Endometriosis   . Fibrocystic breast   . H/O thyroidectomy   . Thyroid disease    Goiters    Past Surgical History:  Procedure Laterality Date  . APPENDECTOMY    . COLPOSCOPY    . PELVIC LAPAROSCOPY  1996  . THYROIDECTOMY     Family History:  Family History  Problem Relation Age of Onset  . Hypertension Father   . Hyperlipidemia Father   . Rheum arthritis Mother    Family Psychiatric  History:  Social History:  Social History   Substance and Sexual Activity  Alcohol Use No  . Alcohol/week: 0.0 standard drinks     Social History   Substance and Sexual Activity  Drug Use No    Social History   Socioeconomic History  . Marital status: Divorced    Spouse name: Not on file  . Number of children: Not on file  . Years of education: Not on file  . Highest education level: Not on file  Occupational History  . Not on file  Social Needs  . Financial resource strain: Not on file  . Food insecurity:    Worry: Not on file    Inability: Not on file  . Transportation needs:    Medical: Not on file    Non-medical: Not on file  Tobacco Use  . Smoking status: Former Research scientist (life sciences)  . Smokeless tobacco: Never Used  Substance and Sexual Activity  . Alcohol use: No  Alcohol/week: 0.0 standard drinks  . Drug use: No  . Sexual activity: Yes    Birth control/protection: Condom    Comment: 1st intercourse 42 yo-More than 5 partners  Lifestyle  . Physical activity:    Days per week: Not on file    Minutes per session: Not on file  . Stress: Not on file  Relationships  . Social connections:    Talks on phone: Not on file    Gets together: Not on file    Attends religious service:  Not on file    Active member of club or organization: Not on file    Attends meetings of clubs or organizations: Not on file    Relationship status: Not on file  Other Topics Concern  . Not on file  Social History Narrative  . 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 acetaminophen (TYLENOL) tablet 650 mg  650 mg Oral Q6H PRN Lindon Romp A, NP   650 mg at 05/29/18 2330  . escitalopram (LEXAPRO) tablet 5 mg  5 mg Oral Daily Dorman Calderwood, Myer Peer, MD   5 mg at 06/01/18 0745  . LORazepam (ATIVAN) tablet 0.5 mg  0.5 mg Oral Q6H PRN Keyon Winnick, Myer Peer, MD      . mirtazapine (REMERON) tablet 7.5 mg  7.5 mg Oral QHS Jacoby Ritsema A, MD      . ondansetron (ZOFRAN) tablet 4 mg  4 mg Oral Q8H PRN Mickie Kozikowski, Myer Peer, MD      . thyroid (ARMOUR) tablet 75 mg  75 mg Oral QAC breakfast Lindon Romp A, NP   75 mg at 06/01/18 0609    Lab Results:  No results found for this or any previous visit (from the past 48 hour(s)).  Blood Alcohol level:  Lab Results  Component Value Date   ETH <10 75/05/2584    Metabolic Disorder Labs: Lab Results  Component Value Date   HGBA1C 5.1 05/29/2018   MPG 99.67 05/29/2018   No results found for: PROLACTIN Lab Results  Component Value Date   CHOL 173 05/29/2018   TRIG 82 05/29/2018   HDL 71 05/29/2018   CHOLHDL 2.4 05/29/2018   VLDL 16 05/29/2018   LDLCALC 86 05/29/2018   LDLCALC 64 01/25/2018    Physical Findings: AIMS: Facial and Oral Movements Muscles of Facial Expression: None, normal Lips and Perioral Area: None, normal Jaw: None, normal Tongue: None, normal,Extremity Movements Upper (arms,  wrists, hands, fingers): None, normal Lower (legs, knees, ankles, toes): None, normal, Trunk Movements Neck, shoulders, hips: None, normal, Overall Severity Severity of abnormal movements (highest score from questions above): None, normal Incapacitation due to abnormal movements: None, normal Patient's awareness of abnormal movements (rate only patient's report): No Awareness, Dental Status Current problems with teeth and/or dentures?: No Does patient usually wear dentures?: No  CIWA:  CIWA-Ar Total: 4 COWS:  COWS Total Score: 2  Musculoskeletal: Strength & Muscle Tone: within normal limits Gait & Station: normal Patient leans: N/A  Psychiatric Specialty Exam: Physical Exam  ROS reports subjective feeling of palpitations has resolved, denies chest pain, no shortness of breath, no vomiting  Blood pressure 136/86, pulse  73, temperature 98.4 F (36.9 C), temperature source Oral, resp. rate 20, height '5\' 6"'  (1.676 m), weight 55.3 kg.Body mass index is 19.69 kg/m.  General Appearance: Well Groomed  Eye Contact:  Good  Speech:  Normal Rate  Volume:  Normal  Mood:  Gradually improving mood  Affect:  More reactive, less anxious  Thought Process:  Linear and Descriptions of Associations: Intact  Orientation:  Other:  Fully alert and attentive  Thought Content:  Denies hallucinations, no delusions, ruminative/focused on medication concerns  Suicidal Thoughts:  No-denies suicidal ideations, no homicidal or violent ideations  Homicidal Thoughts:  No  Memory:  Recent and remote grossly intact  Judgement:  Other:  Improving  Insight:  Fair/improving  Psychomotor Activity:  No psychomotor agitation at this time   Concentration:  Concentration: Good and Attention Span: Good  Recall:  Good  Fund of Knowledge:  Good  Language:  Good  Akathisia:  Negative  Handed:  Right  AIMS (if indicated):     Assets:  Communication Skills Desire for Improvement Physical Health Resilience  ADL's:   Intact  Cognition:  WNL  Sleep:  Number of Hours: 6   Assessment- 42 year old female, presented to the hospital voluntarily due to worsening depression and anxiety which she describes as debilitating.  Reports neurovegetative symptoms and in particular poor sleep/insomnia.  Denies suicidal ideations.  She reports that being away from her children, who are currently with her father, has been a significant stressor. She reports a historical preference for natural supplements and treatment and acknowledges apprehension and concern regarding medications and possible side effects.  Prior to admission had been started on Lexapro which she has been taking at 2.5 mg daily due to concerns of potential side effects.   At this time patient presents with partially improved mood, fuller range and less anxious affect.  No suicidal ideations.  She presents less focused on medication side effects and less ruminative she is currently tolerating Lexapro/Remeron well.    Treatment Plan Summary: Daily contact with patient to assess and evaluate symptoms and progress in treatment, Medication management, Plan Inpatient treatment and Medications as below  Treatment plan reviewed as below today 10/18 Encourage group and milieu participation to work on coping skills and symptom reduction Treatment team working on disposition planning Continue Lexapro  5 mg daily for depression and anxiety Continue Remeron 7.5 mg nightly for depression, anxiety, insomnia-will adjust dose to 8 PM dosing at patient's request Continue Ativan 0.5 mg every 6 hours PRN for anxiety as needed Continue Zofran PRNs for nausea, states she tolerates this medication well , denies side effects  Jenne Campus, MD 06/01/2018, 5:50 PM  Patient ID: Belinda Tucker, female   DOB: [birth year], 42 y.o.   MRN: [id number]

## 2018-06-01 NOTE — [facility] Group Notes (Signed)
  [facility] LCSW Group Therapy Note  Date/Time: 06/01/18, 1315  Type of Therapy/Topic:  Group Therapy:  Emotion Regulation  Participation Level:  Minimal   Mood: pleasant  Description of Group:    The purpose of this group is to assist patients in learning to regulate negative emotions and experience positive emotions. Patients will be guided to discuss ways in which they have been vulnerable to their negative emotions. These vulnerabilities will be juxtaposed with experiences of positive emotions or situations, and patients challenged to use positive emotions to combat negative ones. Special emphasis will be placed on coping with negative emotions in conflict situations, and patients will process healthy conflict resolution skills.  Therapeutic Goals: 1. Patient will identify two positive emotions or experiences to reflect on in order to balance out negative emotions:  2. Patient will label two or more emotions that they find the most difficult to experience:  3. Patient will be able to demonstrate positive conflict resolution skills through discussion or role plays:   Summary of Patient Progress:Pt shared that anxiety is a difficult emotion to experience.  Pt attentive during group discussion and made several comments.         Therapeutic Modalities:   Cognitive Behavioral Therapy Feelings Identification Dialectical Behavioral Therapy  Daleen Squibb, LCSW

## 2018-06-01 NOTE — Progress Notes (Signed)
Adult Psychoeducational Group Note  Date:  06/01/2018 Time:  10:27 PM  Group Topic/Focus:  Wrap-Up Group:   The focus of this group is to help patients review their daily goal of treatment and discuss progress on daily workbooks.  Participation Level:  Active  Participation Quality:  Appropriate  Affect:  Appropriate  Cognitive:  Appropriate  Insight: Appropriate  Engagement in Group:  Engaged  Modes of Intervention:  Discussion  Additional Comments:  Pt stated her goals were to remain around positive people, vibe off of their positivity and use positive affirmations.  Pt spoke with kids and that made her day.  Pt rated the day at a 8/10.  Michio Thier 06/01/2018, 10:27 PM

## 2018-06-02 NOTE — Progress Notes (Signed)
[facility] MD Progress Note  06/02/2018 2:38 PM Belinda Tucker  MRN:  [id number] Subjective: patient reports improving mood , and states " I am feeling better, my mood is improved, and I feel less anxious". Reports no longer experiencing palpitations or subjective irritability/agitation.  Denies medication side effects, and currently no longer as focused on possible  medication side effects, states she is tolerating medications well, without side effects . As she improves she is focusing more on discharge, disposition planning .   Objective : I have reviewed chart notes and have met with patient. 42 year old female, presented to the hospital voluntarily due to worsening depression and anxiety which she describes as debilitating.  Reports neurovegetative symptoms and in particular poor sleep/insomnia.  Denies suicidal ideations.  She reports that being away from her children, who are currently with her father, has been a significant stressor. She reports a historical preference for natural supplements and treatment and acknowledges apprehension and concern regarding medications and possible side effects.  Prior to admission had been started on Lexapro which she has been taking at 2.5 mg daily due to concerns of potential side effects.   Patient presents with significantly improved mood and range of affect, decreased anxiety and less anxious ruminations. States she is feeling better, denies suicidal ideations, and is currently future oriented. She presents less focused on medications and possible side effects, and states she is tolerating current medications and doses well, and feels they are helping. No disruptive or agitated behaviors on unit, going to some groups, interacting appropriately with peers .    Principal Problem:  MDD Diagnosis:   Patient Active Problem List   Diagnosis Date Noted  . MDD (major depressive disorder), recurrent episode, severe ([place]) [F33.2] 05/29/2018  . MDD (major  depressive disorder), severe ([place]) [F32.2] 05/29/2018   Total Time spent with patient: 20 minutes  Past Psychiatric History:   Past Medical History:  Past Medical History:  Diagnosis Date  . Cervical dysplasia 2013  . Endometriosis   . Fibrocystic breast   . H/O thyroidectomy   . Thyroid disease    Goiters    Past Surgical History:  Procedure Laterality Date  . APPENDECTOMY    . COLPOSCOPY    . PELVIC LAPAROSCOPY  1996  . THYROIDECTOMY     Family History:  Family History  Problem Relation Age of Onset  . Hypertension Father   . Hyperlipidemia Father   . Rheum arthritis Mother    Family Psychiatric  History:  Social History:  Social History   Substance and Sexual Activity  Alcohol Use No  . Alcohol/week: 0.0 standard drinks     Social History   Substance and Sexual Activity  Drug Use No    Social History   Socioeconomic History  . Marital status: Divorced    Spouse name: Not on file  . Number of children: Not on file  . Years of education: Not on file  . Highest education level: Not on file  Occupational History  . Not on file  Social Needs  . Financial resource strain: Not on file  . Food insecurity:    Worry: Not on file    Inability: Not on file  . Transportation needs:    Medical: Not on file    Non-medical: Not on file  Tobacco Use  . Smoking status: Former Research scientist (life sciences)  . Smokeless tobacco: Never Used  Substance and Sexual Activity  . Alcohol use: No    Alcohol/week: 0.0 standard drinks  .  Drug use: No  . Sexual activity: Yes    Birth control/protection: Condom    Comment: 1st intercourse 42 yo-More than 5 partners  Lifestyle  . Physical activity:    Days per week: Not on file    Minutes per session: Not on file  . Stress: Not on file  Relationships  . Social connections:    Talks on phone: Not on file    Gets together: Not on file    Attends religious service: Not on file    Active member of club or organization: Not on file    Attends  meetings of clubs or organizations: Not on file    Relationship status: Not on file  Other Topics Concern  . Not on file  Social History Narrative  . 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 acetaminophen (TYLENOL) tablet 650 mg  650 mg Oral Q6H PRN Lindon Romp A, NP   650 mg at 05/29/18 2330  . escitalopram (LEXAPRO) tablet 5 mg  5 mg Oral Daily Cobos, Myer Peer, MD   5 mg at 06/02/18 0804  . LORazepam (ATIVAN) tablet 0.5 mg  0.5 mg Oral Q6H PRN Cobos, Myer Peer, MD      . mirtazapine (REMERON) tablet 7.5 mg  7.5 mg Oral QHS Cobos, Myer Peer, MD   7.5 mg at 06/01/18 2008  . ondansetron (ZOFRAN) tablet 4 mg  4 mg Oral Q8H PRN Cobos, Myer Peer, MD      . thyroid (ARMOUR) tablet 75 mg  75 mg Oral QAC breakfast Lindon Romp A, NP   75 mg at 06/02/18 0608    Lab Results:  No results found for this or any previous visit (from the past 48 hour(s)).  Blood Alcohol level:  Lab Results  Component Value Date   ETH <10 45/80/9983    Metabolic Disorder Labs: Lab Results  Component Value Date   HGBA1C 5.1 05/29/2018   MPG 99.67 05/29/2018   No results found for: PROLACTIN Lab Results  Component Value Date   CHOL 173 05/29/2018   TRIG 82 05/29/2018   HDL 71 05/29/2018   CHOLHDL 2.4 05/29/2018   VLDL 16 05/29/2018   LDLCALC 86 05/29/2018   LDLCALC 64 01/25/2018    Physical Findings: AIMS: Facial and Oral Movements Muscles of Facial Expression: None, normal Lips and Perioral Area: None, normal Jaw: None, normal Tongue: None, normal,Extremity Movements Upper (arms, wrists, hands, fingers): None, normal Lower (legs,  knees, ankles, toes): None, normal, Trunk Movements Neck, shoulders, hips: None, normal, Overall Severity Severity of abnormal movements (highest score from questions above): None, normal Incapacitation due to abnormal movements: None, normal Patient's awareness of abnormal movements (rate only patient's report): No Awareness, Dental Status Current problems with teeth and/or dentures?: No Does patient usually wear dentures?: No  CIWA:  CIWA-Ar Total: 4 COWS:  COWS Total Score: 2  Musculoskeletal: Strength & Muscle Tone: within normal limits Gait & Station: normal Patient leans: N/A  Psychiatric Specialty Exam: Physical Exam  ROS  No headache, no chest pain, no palpitations ,no shortness of breath, no vomiting   Blood pressure 122/85, pulse 72, temperature 98.1 F (  36.7 C), temperature source Oral, resp. rate 20, height '5\' 6"'  (1.676 m), weight 55.3 kg.Body mass index is 19.69 kg/m.  General Appearance: Well Groomed  Eye Contact:  Good  Speech:  Normal Rate  Volume:  Normal  Mood:  improved, today presents euthymic   Affect:  More reactive, brighter , less anxious   Thought Process:  Linear and Descriptions of Associations: Intact  Orientation:  Other:  Fully alert and attentive  Thought Content:  Denies hallucinations, no delusions, ruminative/focused on medication concerns  Suicidal Thoughts:  No-denies suicidal ideations, no homicidal or violent ideations  Homicidal Thoughts:  No  Memory:  Recent and remote grossly intact  Judgement:  Other:  Improving  Insight:  Fair/improving  Psychomotor Activity:  No psychomotor agitation at this time   Concentration:  Concentration: Good and Attention Span: Good  Recall:  Good  Fund of Knowledge:  Good  Language:  Good  Akathisia:  Negative  Handed:  Right  AIMS (if indicated):     Assets:  Communication Skills Desire for Improvement Physical Health Resilience  ADL's:  Intact  Cognition:  WNL  Sleep:  Number of Hours: 6    Assessment- 42 year old female, presented to the hospital voluntarily due to worsening depression and anxiety which she describes as debilitating.  Reports neurovegetative symptoms and in particular poor sleep/insomnia.  Denies suicidal ideations.  She reports that being away from her children, who are currently with her father, has been a significant stressor. She reports a historical preference for natural supplements and treatment and acknowledges apprehension and concern regarding medications and possible side effects.  Prior to admission had been started on Lexapro which she has been taking at 2.5 mg daily due to concerns of potential side effects.   Patient is presenting with improved mood and range of affect, decreased anxiety, decreased anxious ruminations. Tolerating psychiatric medications well at this time. Denies SI.     Treatment Plan Summary: Daily contact with patient to assess and evaluate symptoms and progress in treatment, Medication management, Plan Inpatient treatment and Medications as below  Treatment plan reviewed as below today 10/19 Encourage group and milieu participation to work on coping skills and symptom reduction Treatment team working on disposition planning Continue Lexapro  5 mg daily for depression and anxiety Continue Remeron 7.5 mg nightly for depression, anxiety, insomnia-will adjust dose to 8 PM dosing at patient's request Continue Ativan 0.5 mg every 6 hours PRN for anxiety as needed Continue Zofran PRNs for nausea, states she tolerates this medication well , denies side effects  Jenne Campus, MD 06/02/2018, 2:38 PM

## 2018-06-02 NOTE — [facility] Group Notes (Signed)
[facility] Group Notes:  (Nursing)  Date:  06/02/2018  Time:1:15 PM Type of Therapy:  Nurse Education  Participation Level:  Active  Participation Quality:  Appropriate  Affect:  Appropriate  Cognitive:  Appropriate  Insight:  Appropriate  Engagement in Group:  Engaged  Modes of Intervention:  Discussion and Education  Summary of Progress/Problems:  Nurse led life skills group  Shela Nevin 06/02/2018, 5:27 PM

## 2018-06-02 NOTE — Progress Notes (Signed)
Writer has observed patient up in the dayroom interacting with peers. She reported to Clinical research associate when speaking with her 1:1 that she has had a good day and is hopeful to discharge on tomorrow. She reports being excited about seeing her son and sleeping in her own bed. She attended group and was compliant with medication. Support given and safety maintained on unit with 15 min checks.

## 2018-06-02 NOTE — [facility] Group Notes (Signed)
LCSW Group Therapy Note  06/02/2018   10:00-11:00am   Type of Therapy and Topic:  Group Therapy: Anger Cues and Responses  Participation Level:  Active   Description of Group:   In this group, patients learned how to recognize the physical, cognitive, emotional, and behavioral responses they have to anger-provoking situations.  They identified a recent time they became angry and how they reacted.  They analyzed how their reaction was possibly beneficial and how it was possibly unhelpful.  The group discussed a variety of healthier coping skills that could help with such a situation in the future.  Deep breathing was practiced briefly.  Therapeutic Goals: 1. Patients will remember their last incident of anger and how they felt emotionally and physically, what their thoughts were at the time, and how they behaved. 2. Patients will identify how their behavior at that time worked for them, as well as how it worked against them. 3. Patients will explore possible new behaviors to use in future anger situations. 4. Patients will learn that anger itself is normal and cannot be eliminated, and that healthier reactions can assist with resolving conflict rather than worsening situations.  Summary of Patient Progress:  The patient shared that anger is not an issue for her stated that she was frustrated with her ex-husband. She described her physical cue as becoming anxious and sick on her stomach. Patient also described herself as "godly" and being sober for 10 years. The patient reflected back why its important to adhere to medication regimen as " this will allow you to use your coping skill". Patient was provided with information and knowledge that there are emotional and physical responses associated with anger. Patient understands that anger is a normal and natural human response. Patient is able to identify coping skills that they can readily access and use to mitigate feelings of anger.  Therapeutic  Modalities:   Cognitive Behavioral Therapy  [facility], LCSW  Evorn Gong

## 2018-06-02 NOTE — Progress Notes (Signed)
The patient expressed in group that she had a "great day" since she feels that the medications are beginning to work. She also states that she is improving since she isn't crying as often. Her goal for tomorrow is to try and get more exercise (running).

## 2018-06-02 NOTE — Progress Notes (Signed)
D. Pt presents with an anxious affect,but reports having an "improved mood" and writes "I have hope" on her self inventory sheet. Pt observed interacting well with peers in the dayroom and attending group led by SW. Pt writes that her most important goal today is, "I need rest, I want to laugh and have fun" and "be around fun and positive vibes and set healthy intentions". Pt  currently denies SI/HI and AV hallucinations. A. Labs and vitals monitored. Pt compliant with medications. Pt supported emotionally and encouraged to express concerns and ask questions.   R. Pt remains safe with 15 minute checks. Will continue POC.

## 2018-06-03 MED ORDER — MIRTAZAPINE 7.5 MG PO TABS
7.5000 mg | ORAL_TABLET | Freq: Every day | ORAL | Status: DC
  Administered 2018-06-03: 7.5 mg via ORAL
  Filled 2018-06-03: qty 1
  Filled 2018-06-03: qty 7
  Filled 2018-06-03: qty 1

## 2018-06-03 NOTE — [facility] Group Notes (Signed)
[facility] LCSW Group Therapy Note  Date/Time:  06/03/2018 9:00-10:00 or 10:00-11:00AM  Type of Therapy and Topic:  Group Therapy:  Healthy and Unhealthy Supports  Participation Level:  Active   Description of Group:  Patients in this group were introduced to the idea of adding a variety of healthy supports to address the various needs in their lives.Patients discussed what additional healthy supports could be helpful in their recovery and wellness after discharge in order to prevent future hospitalizations.   An emphasis was placed on using counselor, doctor, therapy groups, 12-step groups, and problem-specific support groups to expand supports.  They also worked as a group on developing a specific plan for several patients to deal with unhealthy supports through boundary-setting, psychoeducation with loved ones, and even termination of relationships.   Therapeutic Goals:   1)  discuss importance of adding supports to stay well once out of the hospital  2)  compare healthy versus unhealthy supports and identify some examples of each  3)  generate ideas and descriptions of healthy supports that can be added  4)  offer mutual support about how to address unhealthy supports  5)  encourage active participation in and adherence to discharge plan    Summary of Patient Progress:  The patient stated that current healthy supports in her life are her family and kids while current unhealthy supports were not identified.  The patient expressed a willingness to maintain AA and churchas supports to help in her recovery journey.   Therapeutic Modalities:   Motivational Interviewing Brief Solution-Focused Therapy  Evorn Gong, LCSW  Evorn Gong

## 2018-06-03 NOTE — Progress Notes (Signed)
[facility] MD Progress Note  06/03/2018 11:52 AM Belinda Tucker  MRN:  [id number] Subjective: patient reports some increased anxiety today, which she attributes in part to not sleeping as well last night. Denies medication side effects at this time.   Objective : I have reviewed chart notes and have met with patient. 42 year old female, presented to the hospital voluntarily due to worsening depression and anxiety which she describes as debilitating.  Reports neurovegetative symptoms and in particular poor sleep/insomnia.  Denies suicidal ideations.  She reports that being away from her children, who are currently with her father, has been a significant stressor. She reports a historical preference for natural supplements and treatment and acknowledges apprehension and concern regarding medications and possible side effects.  Prior to admission had been started on Lexapro which she has been taking at 2.5 mg daily due to concerns of potential side effects.   Patient reports improved mood and decreased anxiety compared to admission, and states she had a " good day yesterday". Today, however, reports some increased anxiety and is again presenting with increased ruminations about possible medication side effects, although to a lesser degree than before. She attributes this to not sleeping well last night.  No disruptive or agitated behaviors on unit, going to groups. Denies SI.    Principal Problem:  MDD Diagnosis:   Patient Active Problem List   Diagnosis Date Noted  . MDD (major depressive disorder), recurrent episode, severe ([place]) [F33.2] 05/29/2018  . MDD (major depressive disorder), severe ([place]) [F32.2] 05/29/2018   Total Time spent with patient: 15 minutes  Past Psychiatric History:   Past Medical History:  Past Medical History:  Diagnosis Date  . Cervical dysplasia 2013  . Endometriosis   . Fibrocystic breast   . H/O thyroidectomy   . Thyroid disease    Goiters    Past Surgical  History:  Procedure Laterality Date  . APPENDECTOMY    . COLPOSCOPY    . PELVIC LAPAROSCOPY  1996  . THYROIDECTOMY     Family History:  Family History  Problem Relation Age of Onset  . Hypertension Father   . Hyperlipidemia Father   . Rheum arthritis Mother    Family Psychiatric  History:  Social History:  Social History   Substance and Sexual Activity  Alcohol Use No  . Alcohol/week: 0.0 standard drinks     Social History   Substance and Sexual Activity  Drug Use No    Social History   Socioeconomic History  . Marital status: Divorced    Spouse name: Not on file  . Number of children: Not on file  . Years of education: Not on file  . Highest education level: Not on file  Occupational History  . Not on file  Social Needs  . Financial resource strain: Not on file  . Food insecurity:    Worry: Not on file    Inability: Not on file  . Transportation needs:    Medical: Not on file    Non-medical: Not on file  Tobacco Use  . Smoking status: Former Research scientist (life sciences)  . Smokeless tobacco: Never Used  Substance and Sexual Activity  . Alcohol use: No    Alcohol/week: 0.0 standard drinks  . Drug use: No  . Sexual activity: Yes    Birth control/protection: Condom    Comment: 1st intercourse 42 yo-More than 5 partners  Lifestyle  . Physical activity:    Days per week: Not on file    Minutes per session: Not  on file  . Stress: Not on file  Relationships  . Social connections:    Talks on phone: Not on file    Gets together: Not on file    Attends religious service: Not on file    Active member of club or organization: Not on file    Attends meetings of clubs or organizations: Not on file    Relationship status: Not on file  Other Topics Concern  . Not on file  Social History Narrative  . Not on file   Additional Social History:    Pain Medications: Pt denies History of alcohol / drug use?: No history of alcohol / drug abuse(Pt has significant history of alcohol use  but reports she has been sober for 10 years.)  Sleep: fair last night  Appetite:  Improving  Current Medications: Current Facility-Administered Medications  Medication Dose Route Frequency Provider Last Rate Last Dose  . acetaminophen (TYLENOL) tablet 650 mg  650 mg Oral Q6H PRN Lindon Romp A, NP   650 mg at 05/29/18 2330  . escitalopram (LEXAPRO) tablet 5 mg  5 mg Oral Daily Cobos, Myer Peer, MD   5 mg at 06/03/18 0807  . LORazepam (ATIVAN) tablet 0.5 mg  0.5 mg Oral Q6H PRN Cobos, Myer Peer, MD      . mirtazapine (REMERON) tablet 7.5 mg  7.5 mg Oral QHS Cobos, Myer Peer, MD   7.5 mg at 06/02/18 2003  . ondansetron (ZOFRAN) tablet 4 mg  4 mg Oral Q8H PRN Cobos, Myer Peer, MD      . thyroid (ARMOUR) tablet 75 mg  75 mg Oral QAC breakfast Lindon Romp A, NP   75 mg at 06/03/18 0092    Lab Results:  No results found for this or any previous visit (from the past 48 hour(s)).  Blood Alcohol level:  Lab Results  Component Value Date   ETH <10 33/00/7622    Metabolic Disorder Labs: Lab Results  Component Value Date   HGBA1C 5.1 05/29/2018   MPG 99.67 05/29/2018   No results found for: PROLACTIN Lab Results  Component Value Date   CHOL 173 05/29/2018   TRIG 82 05/29/2018   HDL 71 05/29/2018   CHOLHDL 2.4 05/29/2018   VLDL 16 05/29/2018   LDLCALC 86 05/29/2018   LDLCALC 64 01/25/2018    Physical Findings: AIMS: Facial and Oral Movements Muscles of Facial Expression: None, normal Lips and Perioral Area: None, normal Jaw: None, normal Tongue: None, normal,Extremity Movements Upper (arms, wrists, hands, fingers): None, normal Lower (legs, knees, ankles, toes): None, normal, Trunk Movements Neck, shoulders, hips: None, normal, Overall Severity Severity of abnormal movements (highest score from questions above): None, normal Incapacitation due to abnormal movements: None, normal Patient's awareness of abnormal movements (rate only patient's report): No Awareness, Dental  Status Current problems with teeth and/or dentures?: No Does patient usually wear dentures?: No  CIWA:  CIWA-Ar Total: 4 COWS:  COWS Total Score: 2  Musculoskeletal: Strength & Muscle Tone: within normal limits Gait & Station: normal Patient leans: N/A  Psychiatric Specialty Exam: Physical Exam  ROS  No headache, no chest pain, no palpitations ,no shortness of breath, no vomiting   Blood pressure 123/72, pulse 64, temperature 98.1 F (36.7 C), temperature source Oral, resp. rate 20, height '5\' 6"'  (1.676 m), weight 55.3 kg.Body mass index is 19.69 kg/m.  General Appearance: Well Groomed  Eye Contact:  Good  Speech:  Normal Rate  Volume:  Normal  Mood:  reports some  increased anxiety today, presents vaguely depressed   Affect:  anxious, but reactive and improves during session, brighter , less anxious   Thought Process:  Linear and Descriptions of Associations: Intact  Orientation:  Other:  Fully alert and attentive  Thought Content:  Denies hallucinations, no delusions, ruminative/focused on medication concerns  Suicidal Thoughts:  No-denies suicidal ideations, no homicidal or violent ideations  Homicidal Thoughts:  No  Memory:  Recent and remote grossly intact  Judgement:  Other:  Improving  Insight:  Fair/improving  Psychomotor Activity:  No psychomotor agitation at this time   Concentration:  Concentration: Good and Attention Span: Good  Recall:  Good  Fund of Knowledge:  Good  Language:  Good  Akathisia:  Negative  Handed:  Right  AIMS (if indicated):     Assets:  Communication Skills Desire for Improvement Physical Health Resilience  ADL's:  Intact  Cognition:  WNL  Sleep:  Number of Hours: 6.25   Assessment- 42 year old female, presented to the hospital voluntarily due to worsening depression and anxiety which she describes as debilitating.  Reports neurovegetative symptoms and in particular poor sleep/insomnia.  Denies suicidal ideations.  She reports that being  away from her children, who are currently with her father, has been a significant stressor. She reports a historical preference for natural supplements and treatment and acknowledges apprehension and concern regarding medications and possible side effects.  Prior to admission had been started on Lexapro which she has been taking at 2.5 mg daily due to concerns of potential side effects.   Patient reports increased anxiety today associated with not sleeping as well last night, and presents with increased ruminations about medications today, although states she does feel Remeron and Lexapro are well tolerated at present. Overall, she does report significant improvement compared to how she felt on admission. Responds well to reassurance, support, and review of medication issues - yesterday had requested that Remeron dosing be at 8,00 pm but feels that this earlier dosing  might have contributed to poor sleep as remained upafter that time since there was still a lot of activity on unit and spent time interacting with peers .     Treatment Plan Summary: Daily contact with patient to assess and evaluate symptoms and progress in treatment, Medication management, Plan Inpatient treatment and Medications as below  Treatment plan reviewed as below today 10/20 Encourage group and milieu participation to work on coping skills and symptom reduction Treatment team working on disposition planning Continue Lexapro  5 mg daily for depression and anxiety Continue Remeron 7.5 mg nightly for depression, anxiety, insomnia-will adjust dose to 22:00 see above. Continue Ativan 0.5 mg every 6 hours PRN for anxiety as needed Continue Zofran PRNs for nausea, states she tolerates this medication well , denies side effects  Jenne Campus, MD 06/03/2018, 11:52 AM   Patient ID: Belinda Tucker, female   DOB: January 28, 1976, 42 y.o.   MRN: [id number]

## 2018-06-03 NOTE — [facility] Group Notes (Signed)
[facility] Group Notes:  (Nursing)  Date:  06/03/2018  Time:1:15 PM Type of Therapy:  Nurse Education  Participation Level:  Active  Participation Quality:  Appropriate  Affect:  Appropriate  Cognitive:  Appropriate  Insight:  Appropriate  Engagement in Group:  Engaged  Modes of Intervention:  Discussion and Education  Summary of Progress/Problems: Nurse led group- Healthy Support Systems  Shela Nevin 06/03/2018, 7:24 PM

## 2018-06-03 NOTE — Progress Notes (Signed)
D. Pt presents with an anxious affect, teary this morning upon initial approach - reports having had poor sleep last night and endorses low energy and poor concentration. Pt writes in self inventory, "I'm sad because I have no energy", "I'm anxious because my energy is low",and "I have some hope". Pt appeared somewhat somatic this am- voicing concern over her blood pressure (blood pressure was 111/76 this am- rechecked later manually: 125/88.) Pt writes that her goal today is "I want my med to be regulated and to run in the gym but my energy is low". Pt also writes, "I want and need some energy, my blood pressure to go up and sleep". Pt currently denies SI/HI and AV hallucinations. A. Labs and vitals monitored. Pt compliant with medications. Pt supported emotionally and encouraged to express concerns and ask questions.   R. Pt remains safe with 15 minute checks. Will continue POC.

## 2018-06-03 NOTE — Progress Notes (Signed)
Patient ID: Belinda Tucker, female   DOB: 11-Oct-1975, 42 y.o.   MRN: [id number]  Nursing Progress Note  Data: Patient presents mildly anxious and with tangential speech. Patient is seen smiling and interacting with peers. Patient denies concerns for writer this evening. Patient reports she is excited to discharge tomorrow. Patient currently denies SI/HI/AVH.   Action: Patient is educated about and provided medication per provider's orders. Patient safety maintained with q15 min safety checks and frequent rounding. Low fall risk precautions in place. Emotional support given. 1:1 interaction and active listening provided. Labs, vital signs and patient behavior monitored throughout shift.   Response: Patient remains safe on the unit at this time and agrees to come to staff with any issues/concerns. Will continue to support and monitor.

## 2018-06-04 MED ORDER — ESCITALOPRAM OXALATE 5 MG PO TABS: 5.0000 mg | ORAL_TABLET | Freq: Every day | ORAL | 0 refills | Status: AC

## 2018-06-04 MED ORDER — LORAZEPAM 0.5 MG PO TABS: 0.5000 mg | ORAL_TABLET | Freq: Three times a day (TID) | ORAL | 0 refills | Status: AC | PRN

## 2018-06-04 MED ORDER — MIRTAZAPINE 7.5 MG PO TABS: 7.5000 mg | ORAL_TABLET | Freq: Every day | ORAL | 0 refills | Status: AC

## 2018-06-04 MED ORDER — LORAZEPAM 0.5 MG PO TABS: 0.5000 mg | ORAL_TABLET | Freq: Three times a day (TID) | ORAL | 0 refills | Status: DC | PRN

## 2018-06-04 MED ORDER — THYROID 15 MG PO TABS: 75.0000 mg | ORAL_TABLET | Freq: Every day | ORAL | 0 refills | Status: DC

## 2018-06-04 NOTE — [facility] Suicide Risk Assessment (Signed)
[facility] Discharge Suicide Risk Assessment   Principal Problem:  Depression, Anxiety Discharge Diagnoses:  Patient Active Problem List   Diagnosis Date Noted  . MDD (major depressive disorder), recurrent episode, severe ([facility]) [F33.2] 05/29/2018  . MDD (major depressive disorder), severe ([facility]) [F32.2] 05/29/2018    Total Time spent with patient: 30 minutes  Musculoskeletal: Strength & Muscle Tone: within normal limits Gait & Station: normal Patient leans: N/A  Psychiatric Specialty Exam: ROS no headache, no chest pain, no shortness of breath, no vomiting , no rash  Blood pressure 108/72, pulse 74, temperature 97.6 F (36.4 C), temperature source Oral, resp. rate 16, height 5\' 6"  (1.676 m), weight 55.3 kg.Body mass index is 19.69 kg/m.  General Appearance: Well Groomed  Eye Contact::  Good  Speech:  Normal Rate409  Volume:  Normal  Mood:  improved , presents euthymic, less anxious  Affect:  more reactive , smiles appropriately, less anxious today  Thought Process:  Linear and Descriptions of Associations: Intact  Orientation:  Full (Time, Place, and Person)  Thought Content:  no hallucinations, no delusions   Suicidal Thoughts:  No denies suicidal or self injurious ideations, denies homicidal or violent ideations  Homicidal Thoughts:  No  Memory:  recent and remote grossly intact   Judgement:  Other:  improving  Insight:  improving   Psychomotor Activity:  Normal  Concentration:  Good  Recall:  Good  Fund of Knowledge:Good  Language: Negative  Akathisia:  Negative  Handed:  Right  AIMS (if indicated):     Assets:  Desire for Improvement Resilience  Sleep:  Number of Hours: 6.75  Cognition: WNL  ADL's:  Intact   Mental Status Per Nursing Assessment::   On Admission:  NA  Demographic Factors:  42, divorced, has two children, currently unemployed   Loss Factors: Reports recent exposure to " black mold ", her children currently are with their father  Historical  Factors: No prior psychiatric admissions, no history of suicide attempt, no history of psychosis, history of anxiety, remote history of alcohol use disorder  Risk Reduction Factors:   Responsible for children under 33 years of age, Sense of responsibility to family and Positive coping skills or problem solving skills  Continued Clinical Symptoms:  At this time patient is alert, attentive, well related, good eye contact, reports feeling better, mood improved, affect improved, less anxious, less ruminative, brighter overall, no thought disorder, no suicidal or self injurious ideations, no hallucinations, no delusions, future oriented . Behavior on unit in good control, pleasant on approach. Denies medication side effects at this time- side effects discussed- potential risk of sedation on Remeron reviewed, emphasized importance of avoiding driving if sedated .   Cognitive Features That Contribute To Risk:  No gross cognitive deficits noted upon discharge. Is alert , attentive, and oriented x 3     Suicide Risk:  Mild:  Suicidal ideation of limited frequency, intensity, duration, and specificity.  There are no identifiable plans, no associated intent, mild dysphoria and related symptoms, good self-control (both objective and subjective assessment), few other risk factors, and identifiable protective factors, including available and accessible social support.  Follow-up Information    [facility] Go on 06/06/2018.   Why:  Appointment for medication management and therapy services is Wednesday, 06/06/18 at 8:45am. Please be sure to bring your Photo ID, any insurance information and any dicharge paperwork including your list of medications. Contact information: [address] [place] [address] [phone]  Plan Of Care/Follow-up recommendations:  Activity:  as tolerated  Diet:  regular Tests:  NA Other:  See below  Patient expresses readiness for discharge and is leaving unit  in good spirits Plans to return home Follow up as above .  Craige Cotta, MD 06/04/2018, 11:37 AM

## 2018-06-04 NOTE — Progress Notes (Signed)
Adult Psychoeducational Group Note  Date:  06/04/2018 Time:  4:44 AM  Group Topic/Focus:  Wrap-Up Group:   The focus of this group is to help patients review their daily goal of treatment and discuss progress on daily workbooks.  Participation Level:  Active  Participation Quality:  Appropriate  Affect:  Appropriate  Cognitive:  Appropriate  Insight: Appropriate and Good  Engagement in Group:  Engaged  Modes of Intervention:  Discussion  Additional Comments:  Pt attend wrap up group. Her day was a 10.0the positive thing that happen to her today shee gets to go home tomorrow. She visit family  Chauncey Fischer 06/04/2018, 4:44 AM

## 2018-06-04 NOTE — Discharge Summary (Addendum)
Physician Discharge Summary Note  Patient:  Belinda Tucker is an 42 y.o., female MRN:  [id number] DOB:  [birth year] Patient phone:  [phone] (home)  Patient address:   [address] [place] [address],  Total Time spent with patient: 15 minutes  Date of Admission:  05/29/2018 Date of Discharge: 06/04/2018  Reason for Admission: Per admission assessment note:42 year old female, presented to hospital voluntarily due to worsening depression and anxiety. States symptoms had been progressing and becoming more debilitating.Reports " I had not been eating or sleeping well for a couple of weeks". States that symptoms started about two months ago, and states that she feels she was exposed to " black mold" or some "micro- toxin in the environment", which triggered decompensation. States she made decision for her children to go to their father in order " for me to work on getting healthy again", but being away from them has contributed to depression, anxiety. Patient reports that about two weeks ago, due to worsening symptoms, she saw her PCP who initially started low dose Zoloft and Buspar, and more recently switched to Lexapro, which she states she is taking at 2.5 mgrs QDAY . Patient reports she feels she is very sensitive to medication side effects, worries about potential side effects, and so has opted for low dose management. States " I normally do not take medications, I take only natural products " and acknowledges apprehension about psychiatric medications, but states that due to severity of her symptoms she opted to start antidepressant , but feels she has only been able to tolerate low doses .  Principal Problem: MDD (major depressive disorder), recurrent episode, severe [facility]) Discharge Diagnoses: Patient Active Problem List   Diagnosis Date Noted  . MDD (major depressive disorder), recurrent episode, severe (HCC) [F33.2] 05/29/2018  . MDD (major depressive disorder), severe  (HCC) [F32.2] 05/29/2018    Past Psychiatric History:   Past Medical History:  Past Medical History:  Diagnosis Date  . Cervical dysplasia 2013  . Endometriosis   . Fibrocystic breast   . H/O thyroidectomy   . Thyroid disease    [place]    Past Surgical History:  Procedure Laterality Date  . APPENDECTOMY    . COLPOSCOPY    . PELVIC LAPAROSCOPY  1996  . THYROIDECTOMY     Family History:  Family History  Problem Relation Age of Onset  . Hypertension Father   . Hyperlipidemia Father   . Rheum arthritis Mother    Family Psychiatric  History:  Social History:  Social History   Substance and Sexual Activity  Alcohol Use No  . Alcohol/week: 0.0 standard drinks     Social History   Substance and Sexual Activity  Drug Use No    Social History   Socioeconomic History  . Marital status: Divorced    Spouse name: Not on file  . Number of children: Not on file  . Years of education: Not on file  . Highest education level: Not on file  Occupational History  . Not on file  Social Needs  . Financial resource strain: Not on file  . Food insecurity:    Worry: Not on file    Inability: Not on file  . Transportation needs:    Medical: Not on file    Non-medical: Not on file  Tobacco Use  . Smoking status: Former Games developer  . Smokeless tobacco: Never Used  Substance and Sexual Activity  . Alcohol use: No    Alcohol/week:  0.0 standard drinks  . Drug use: No  . Sexual activity: Yes    Birth control/protection: Condom    Comment: 1st intercourse 42 yo-More than 5 partners  Lifestyle  . Physical activity:    Days per week: Not on file    Minutes per session: Not on file  . Stress: Not on file  Relationships  . Social connections:    Talks on phone: Not on file    Gets together: Not on file    Attends religious service: Not on file    Active member of club or organization: Not on file    Attends meetings of clubs or organizations: Not on file    Relationship  status: Not on file  Other Topics Concern  . Not on file  Social History Narrative  . Not on file    Hospital Course:  Belinda Tucker was admitted for MDD (major depressive disorder), recurrent episode, severe (HCC) and crisis management.  Pt was treated discharged with the medications listed below under Medication List.  Medical problems were identified and treated as needed.  Home medications were restarted as appropriate.  Improvement was monitored by observation and Belinda Tucker 's daily report of symptom reduction.  Emotional and mental status was monitored by daily self-inventory reports completed by Belinda Tucker and clinical staff.         Belinda Tucker was evaluated by the treatment team for stability and plans for continued recovery upon discharge. Belinda Tucker 's motivation was an integral factor for scheduling further treatment. Employment, transportation, bed availability, health status, family support, and any pending legal issues were also considered during hospital stay. Pt was offered further treatment options upon discharge including but not limited to Residential, Intensive Outpatient, and Outpatient treatment.  Belinda Tucker will follow up with the services as listed below under Follow Up Information.     Upon completion of this admission the patient was both mentally and medically stable for discharge denying suicidal or homicidal ideations at discharge  Belinda Tucker responded well to treatment with Lexapro 5 mg and Remeron 7.5mg  and Ativan 0.5mg  PRN without adverse effects.. Pt demonstrated improvement without reported or observed adverse effects to the point of stability appropriate for outpatient management. Pertinent labs include:CMPfor which outpatient follow-up is necessary for lab recheck as mentioned below. Reviewed CBC, CMP, BAL, and UDS-; all unremarkable aside from noted exceptions.   Physical Findings: AIMS: Facial and Oral Movements Muscles  of Facial Expression: None, normal Lips and Perioral Area: None, normal Jaw: None, normal Tongue: None, normal,Extremity Movements Upper (arms, wrists, hands, fingers): None, normal Lower (legs, knees, ankles, toes): None, normal, Trunk Movements Neck, shoulders, hips: None, normal, Overall Severity Severity of abnormal movements (highest score from questions above): None, normal Incapacitation due to abnormal movements: None, normal Patient's awareness of abnormal movements (rate only patient's report): No Awareness, Dental Status Current problems with teeth and/or dentures?: No Does patient usually wear dentures?: No  CIWA:  CIWA-Ar Total: 1 COWS:  COWS Total Score: 1  Musculoskeletal: Strength & Muscle Tone: within normal limits Gait & Station: normal Patient leans: N/A  Psychiatric Specialty Exam: See SRA by MD Physical Exam  Review of Systems  Psychiatric/Behavioral: Negative for depression (improved) and suicidal ideas. The patient is not nervous/anxious (improving).   All other systems reviewed and are negative.   Blood pressure 108/72, pulse 74, temperature 97.6 F (36.4 C), temperature source Oral, resp. rate 16, height 5\' 6"  (1.676  m), weight 55.3 kg.Body mass index is 19.69 kg/m.   Have you used any form of tobacco in the last 30 days? (Cigarettes, Smokeless Tobacco, Cigars, and/or Pipes): No  Has this patient used any form of tobacco in the last 30 days? (Cigarettes, Smokeless Tobacco, Cigars, and/or Pipes) No  Blood Alcohol level:  Lab Results  Component Value Date   ETH <10 05/29/2018    Metabolic Disorder Labs:  Lab Results  Component Value Date   HGBA1C 5.1 05/29/2018   MPG 99.67 05/29/2018   No results found for: PROLACTIN Lab Results  Component Value Date   CHOL 173 05/29/2018   TRIG 82 05/29/2018   HDL 71 05/29/2018   CHOLHDL 2.4 05/29/2018   VLDL 16 05/29/2018   LDLCALC 86 05/29/2018   LDLCALC 64 01/25/2018    See Psychiatric Specialty Exam  and Suicide Risk Assessment completed by Attending Physician prior to discharge.  Discharge destination:  Home  Is patient on multiple antipsychotic therapies at discharge:  No   Has Patient had three or more failed trials of antipsychotic monotherapy by history:  No  Recommended Plan for Multiple Antipsychotic Therapies: NA  Discharge Instructions    Diet - low sodium heart healthy   Complete by:  As directed    Discharge instructions   Complete by:  As directed    Take all medications as prescribed. Keep all follow-up appointments as scheduled.  Do not consume alcohol or use illegal drugs while on prescription medications. Report any adverse effects from your medications to your primary care provider promptly.  In the event of recurrent symptoms or worsening symptoms, call 911, a crisis hotline, or go to the nearest emergency department for evaluation.   Increase activity slowly   Complete by:  As directed      Allergies as of 06/04/2018      Reactions   Latex Hives   Morphine And Related Hives      Medication List    STOP taking these medications   Magnesium 500 MG Tabs   Melatonin 3 MG Tabs   ondansetron 4 MG disintegrating tablet Commonly known as:  ZOFRAN-ODT     TAKE these medications     Indication  escitalopram 5 MG tablet Commonly known as:  LEXAPRO Take 1 tablet (5 mg total) by mouth daily. Start taking on:  06/05/2018 What changed:  how much to take  Indication:  Major Depressive Disorder   LORazepam 0.5 MG tablet Commonly known as:  ATIVAN Take 1 tablet (0.5 mg total) by mouth every 8 (eight) hours as needed for anxiety.  Indication:  Anxiousness associated with Depression   mirtazapine 7.5 MG tablet Commonly known as:  REMERON Take 1 tablet (7.5 mg total) by mouth at bedtime.  Indication:  Major Depressive Disorder   thyroid 15 MG tablet Commonly known as:  ARMOUR Take 5 tablets (75 mg total) by mouth daily before breakfast. Start taking  on:  06/05/2018 What changed:    medication strength  how much to take  how to take this  when to take this  additional instructions  Indication:  Underactive Thyroid      Follow-up Information    [facility]. Go on 06/06/2018.   Why:  Appointment for medication management and therapy services is Wednesday, 06/06/18 at 8:45am. Please be sure to bring your Photo ID, any insurance information and any dicharge paperwork including your list of medications. Contact information: [address] [place] [address] [phone]  Follow-up recommendations:  Activity:  as tolerated Diet:  heart healthy  Comments:  Take all medications as prescribed. Keep all follow-up appointments as scheduled.  Do not consume alcohol or use illegal drugs while on prescription medications. Report any adverse effects from your medications to your primary care provider promptly.  In the event of recurrent symptoms or worsening symptoms, call 911, a crisis hotline, or go to the nearest emergency department for evaluation.   Signed: Oneta Rack, NP 06/04/2018, 11:40 AM  Patient seen, Suicide Assessment Completed.  Disposition Plan Reviewed

## 2018-06-04 NOTE — Progress Notes (Signed)
D:  Patient's self inventory sheet, patient sleeps good, no sleep medication.  Good appetite, normal energy level, good concentration.  Denied depression, hopeless and anxiety, then wrote in depression.  Denied withdrawals.  Denied SI.  Denied physical problems.  Denied physical pain.  Goal is discharge and see children/family.  Plans to do what it takes, pray and have positive attitude.  "Thank you for everything."  Does have discharge plans. A:  Medications administered per MD orders.  Emotional support and encouragement given patient. R:  Denied SI and Hi, contracts for safety.  Denied A/V hallucinations.  Safety maintained with 15 minute checks.

## 2018-06-04 NOTE — Progress Notes (Signed)
  [facility] Adult Case Management Discharge Plan :  Will you be returning to the same living situation after discharge:  Yes,  patient reports she was returning home, alone At discharge, do you have transportation home?: Yes,  patient reports her car is in the [facility] parking lot Do you have the ability to pay for your medications: No.  Release of information consent forms completed and in the chart;  Patient's signature needed at discharge.  Patient to Follow up at: Follow-up Information    [facility]. Go on 06/06/2018.   Why:  Appointment for medication management and therapy services is Wednesday, 06/06/18 at 8:45am. Please be sure to bring your Photo ID, any insurance information and any dicharge paperwork including your list of medications. Contact information: [address] [place] [address] [phone]           Next level of care provider has access to [facility] Link:yes  Safety Planning and Suicide Prevention discussed: Yes,  with the patient  Have you used any form of tobacco in the last 30 days? (Cigarettes, Smokeless Tobacco, Cigars, and/or Pipes): No  Has patient been referred to the Quitline?: N/A patient is not a smoker  Patient has been referred for addiction treatment: N/A  Maeola Sarah, LCSWA 06/04/2018, 4:19 PM

## 2018-06-04 NOTE — Progress Notes (Signed)
Recreation Therapy Notes  Date: 10.21.19 Time: 0930 Location: [address] Dayroom  Group Topic: Stress Management  Goal Area(s) Addresses:  Patient will verbalize importance of using healthy stress management.  Patient will identify positive emotions associated with healthy stress management.   Intervention: Stress Management  Activity : Meditation.  LRT introduced the stress management technique of meditation.  LRT played a meditation that dealt with impermanence.  Patients were to follow along as the meditation played to engage in the meditation.  Education:  Stress Management, Discharge Planning.   Education Outcome: Acknowledges edcuation/In group clarification offered/Needs additional education  Clinical Observations/Feedback: Pt did not attend group.    Nikodem Leadbetter, LRT/CTRS         Gizzelle Lacomb A 06/04/2018 11:27 AM 

## 2018-06-04 NOTE — Progress Notes (Addendum)
Discharge Note:  Patient discharged home.  Patient's car in parking lot.  Patient denied SI and HI.  Denied A/V hallucinations.  Suicide prevention information given and discussed with patient who stated she understood and had no questions.  [employer] Suicide Prevention App information given to patient at discharge.  Patient stated she received all her belongings, clothing, toiletries, misc items, prescriptions, etc.  Patient stated she appreciated all assistance received from [facility] staff.  All required discharge information given to patient at discharge.  Patient completed her survey and placed in appropriate black box.

## 2018-06-04 NOTE — Progress Notes (Signed)
Patient ID: Belinda Tucker, female   DOB: [birth year], 42 y.o.   MRN: [id number] D: Assumed care patient @ 2330. Patient in bed sleeping. Respiration regular and unlabored. No sign of distress noted at this time A: 15 mins checks for safety. R: Patient remains safe.

## 2018-06-04 NOTE — Tx Team (Signed)
Interdisciplinary Treatment and Diagnostic Plan Update  06/04/2018 Time of Session: 1346 LARITZA VOKES MRN: [id number]  Principal Diagnosis: <principal problem not specified>  Secondary Diagnoses: Active Problems:   MDD (major depressive disorder), recurrent episode, severe (HCC)   MDD (major depressive disorder), severe (HCC)   Current Medications:  Current Facility-Administered Medications  Medication Dose Route Frequency Provider Last Rate Last Dose  . acetaminophen (TYLENOL) tablet 650 mg  650 mg Oral Q6H PRN Nira Conn A, NP   650 mg at 05/29/18 2330  . escitalopram (LEXAPRO) tablet 5 mg  5 mg Oral Daily Cobos, Rockey Situ, MD   5 mg at 06/04/18 0806  . LORazepam (ATIVAN) tablet 0.5 mg  0.5 mg Oral Q6H PRN Cobos, Rockey Situ, MD      . mirtazapine (REMERON) tablet 7.5 mg  7.5 mg Oral QHS Cobos, Rockey Situ, MD   7.5 mg at 06/03/18 2113  . ondansetron (ZOFRAN) tablet 4 mg  4 mg Oral Q8H PRN Cobos, Rockey Situ, MD      . thyroid (ARMOUR) tablet 75 mg  75 mg Oral QAC breakfast Nira Conn A, NP   75 mg at 06/04/18 0617   PTA Medications: Medications Prior to Admission  Medication Sig Dispense Refill Last Dose  . escitalopram (LEXAPRO) 5 MG tablet Take 2.5 mg by mouth daily.   0 Past Week at Unknown time  . Melatonin 3 MG TABS Take 3 mg by mouth at bedtime as needed (sleep).     . ondansetron (ZOFRAN-ODT) 4 MG disintegrating tablet Take 4 mg by mouth every 8 (eight) hours as needed for nausea or vomiting.   Past Month at Unknown time  . Magnesium 500 MG TABS Take by mouth.   Taking  . thyroid (NP THYROID) 30 MG tablet TAKE 2 1/2 TABLET BY MOUTH DAILY BEFORE BREAKFAST. 75 tablet 12     Patient Stressors: Financial difficulties Marital or family conflict Medication change or noncompliance  Patient Strengths: Ability for insight Average or above average intelligence Capable of independent living General fund of knowledge  Treatment Modalities: Medication Management, Group  therapy, Case management,  1 to 1 session with clinician, Psychoeducation, Recreational therapy.   Physician Treatment Plan for Primary Diagnosis: <principal problem not specified> Long Term Goal(s): Improvement in symptoms so as ready for discharge Improvement in symptoms so as ready for discharge   Short Term Goals: Ability to identify changes in lifestyle to reduce recurrence of condition will improve Ability to maintain clinical measurements within normal limits will improve Ability to identify changes in lifestyle to reduce recurrence of condition will improve Ability to verbalize feelings will improve Ability to disclose and discuss suicidal ideas Ability to demonstrate self-control will improve Ability to identify and develop effective coping behaviors will improve Ability to maintain clinical measurements within normal limits will improve  Medication Management: Evaluate patient's response, side effects, and tolerance of medication regimen.  Therapeutic Interventions: 1 to 1 sessions, Unit Group sessions and Medication administration.  Evaluation of Outcomes: Adequate for Discharge  Physician Treatment Plan for Secondary Diagnosis: Active Problems:   MDD (major depressive disorder), recurrent episode, severe (HCC)   MDD (major depressive disorder), severe (HCC)  Long Term Goal(s): Improvement in symptoms so as ready for discharge Improvement in symptoms so as ready for discharge   Short Term Goals: Ability to identify changes in lifestyle to reduce recurrence of condition will improve Ability to maintain clinical measurements within normal limits will improve Ability to identify changes in lifestyle to reduce  recurrence of condition will improve Ability to verbalize feelings will improve Ability to disclose and discuss suicidal ideas Ability to demonstrate self-control will improve Ability to identify and develop effective coping behaviors will improve Ability to maintain  clinical measurements within normal limits will improve     Medication Management: Evaluate patient's response, side effects, and tolerance of medication regimen.  Therapeutic Interventions: 1 to 1 sessions, Unit Group sessions and Medication administration.  Evaluation of Outcomes: Adequate for Discharge   RN Treatment Plan for Primary Diagnosis: <principal problem not specified> Long Term Goal(s): Knowledge of disease and therapeutic regimen to maintain health will improve  Short Term Goals: Ability to identify and develop effective coping behaviors will improve and Compliance with prescribed medications will improve  Medication Management: RN will administer medications as ordered by provider, will assess and evaluate patient's response and provide education to patient for prescribed medication. RN will report any adverse and/or side effects to prescribing provider.  Therapeutic Interventions: 1 on 1 counseling sessions, Psychoeducation, Medication administration, Evaluate responses to treatment, Monitor vital signs and CBGs as ordered, Perform/monitor CIWA, COWS, AIMS and Fall Risk screenings as ordered, Perform wound care treatments as ordered.  Evaluation of Outcomes: Adequate for Discharge   LCSW Treatment Plan for Primary Diagnosis: <principal problem not specified> Long Term Goal(s): Safe transition to appropriate next level of care at discharge, Engage patient in therapeutic group addressing interpersonal concerns.  Short Term Goals: Engage patient in aftercare planning with referrals and resources, Increase social support and Increase skills for wellness and recovery  Therapeutic Interventions: Assess for all discharge needs, 1 to 1 time with Social worker, Explore available resources and support systems, Assess for adequacy in community support network, Educate family and significant other(s) on suicide prevention, Complete Psychosocial Assessment, Interpersonal group  therapy.  Evaluation of Outcomes: Adequate for Discharge   Progress in Treatment: Attending groups: Yes. Participating in groups: Yes. Taking medication as prescribed: Yes. Toleration medication: Yes. Family/Significant other contact made: No, will contact:  when given permission Patient understands diagnosis: Yes. Discussing patient identified problems/goals with staff: Yes. Medical problems stabilized or resolved: Yes. Denies suicidal/homicidal ideation: Yes. Issues/concerns per patient self-inventory: No. Other: none  New problem(s) identified: No, Describe:  none  New Short Term/Long Term Goal(s):medication stabilization, elimination of SI thoughts, development of comprehensive mental wellness plan.    Patient Goals:  "get meds straight so my emotions are better and improve sleep"  Discharge Plan or Barriers: Patient is discharging home and plans to follow up with [facility] for outpatient medication management and therapy services.   Reason for Continuation of Hospitalization:   Estimated Length of Stay: 06/04/2018   Attendees: Patient:Ahlani Astorino 05/30/2018   Physician: Javier Docker, MD 05/30/2018   Nursing: Quintella Reichert, RN 05/30/2018   RN Care Manager: Onnie Boer, RN 05/30/2018   Social Worker: Daleen Squibb, LCSW; Baldo Daub, LCSWA 05/30/2018   Recreational Therapist: X 05/30/2018   Other:  05/30/2018   Other:  05/30/2018   Other: 05/30/2018       Scribe for Treatment Team: Maeola Sarah, LCSWA 06/04/2018 8:39 AM

## 2018-07-10 ENCOUNTER — Emergency Department (HOSPITAL_COMMUNITY): Payer: Self-pay

## 2018-07-10 ENCOUNTER — Emergency Department (HOSPITAL_COMMUNITY)
Admission: EM | Admit: 2018-07-10 | Discharge: 2018-07-10 | Disposition: A | Discharge status: Home / Self Care | Payer: Self-pay | Attending: Emergency Medicine | Admitting: Emergency Medicine

## 2018-07-10 ENCOUNTER — Encounter (HOSPITAL_COMMUNITY): Payer: Self-pay

## 2018-07-10 ENCOUNTER — Other Ambulatory Visit: Payer: Self-pay

## 2018-07-10 DIAGNOSIS — Z87891 Personal history of nicotine dependence: Secondary | ICD-10-CM | POA: Insufficient documentation

## 2018-07-10 DIAGNOSIS — F418 Other specified anxiety disorders: Secondary | ICD-10-CM

## 2018-07-10 DIAGNOSIS — F419 Anxiety disorder, unspecified: Secondary | ICD-10-CM | POA: Insufficient documentation

## 2018-07-10 DIAGNOSIS — Z79899 Other long term (current) drug therapy: Secondary | ICD-10-CM | POA: Insufficient documentation

## 2018-07-10 LAB — COMPREHENSIVE METABOLIC PANEL
ALT: 13 U/L (ref 0–44)
AST: 18 U/L (ref 15–41)
Albumin: 3.9 g/dL (ref 3.5–5.0)
Alkaline Phosphatase: 39 U/L (ref 38–126)
Anion gap: 9 (ref 5–15)
BUN: 11 mg/dL (ref 6–20)
CHLORIDE: 110 mmol/L (ref 98–111)
CO2: 18 mmol/L — AB (ref 22–32)
CREATININE: 0.78 mg/dL (ref 0.44–1.00)
Calcium: 8.9 mg/dL (ref 8.9–10.3)
GFR calc Af Amer: >60 mL/min (ref >60)
GFR calc non Af Amer: >60 mL/min (ref >60)
GLUCOSE: 148 mg/dL — AB (ref 70–99)
Potassium: 3.3 mmol/L — ABNORMAL LOW (ref 3.5–5.1)
SODIUM: 137 mmol/L (ref 135–145)
Total Bilirubin: 1.1 mg/dL (ref 0.3–1.2)
Total Protein: 6.6 g/dL (ref 6.5–8.1)

## 2018-07-10 LAB — CBC WITH DIFFERENTIAL/PLATELET
Abs Immature Granulocytes: 0.05 10*3/uL (ref 0.00–0.07)
Basophils Absolute: 0 10*3/uL (ref 0.0–0.1)
Basophils Relative: 0 %
EOS PCT: 0 %
Eosinophils Absolute: 0 10*3/uL (ref 0.0–0.5)
HEMATOCRIT: 40 % (ref 36.0–46.0)
Hemoglobin: 13.4 g/dL (ref 12.0–15.0)
Immature Granulocytes: 1 %
LYMPHS ABS: 1.8 10*3/uL (ref 0.7–4.0)
LYMPHS PCT: 27 %
MCH: 31.4 pg (ref 26.0–34.0)
MCHC: 33.5 g/dL (ref 30.0–36.0)
MCV: 93.7 fL (ref 80.0–100.0)
MONO ABS: 0.4 10*3/uL (ref 0.1–1.0)
MONOS PCT: 6 %
Neutro Abs: 4.3 10*3/uL (ref 1.7–7.7)
Neutrophils Relative %: 66 %
Platelets: 277 10*3/uL (ref 150–400)
RBC: 4.27 MIL/uL (ref 3.87–5.11)
RDW: 12.1 % (ref 11.5–15.5)
WBC: 6.7 10*3/uL (ref 4.0–10.5)
nRBC: 0 % (ref 0.0–0.2)

## 2018-07-10 LAB — URINALYSIS, ROUTINE W REFLEX MICROSCOPIC
Bilirubin Urine: NEGATIVE
Glucose, UA: >=500 mg/dL — AB
Ketones, ur: NEGATIVE mg/dL
LEUKOCYTES UA: NEGATIVE
NITRITE: NEGATIVE
PROTEIN: NEGATIVE mg/dL
SPECIFIC GRAVITY, URINE: 1.011 (ref 1.005–1.030)
pH: 7 (ref 5.0–8.0)

## 2018-07-10 LAB — D-DIMER, QUANTITATIVE: D-Dimer, Quant: 0.33 ug/mL-FEU (ref 0.00–0.50)

## 2018-07-10 LAB — LIPASE, BLOOD: Lipase: 44 U/L (ref 11–51)

## 2018-07-10 LAB — TSH: TSH: 0.834 u[IU]/mL (ref 0.350–4.500)

## 2018-07-10 LAB — CBG MONITORING, ED: GLUCOSE-CAPILLARY: 129 mg/dL — AB (ref 70–99)

## 2018-07-10 LAB — TROPONIN I: Troponin I: <0.03 ng/mL (ref <0.03)

## 2018-07-10 NOTE — Discharge Instructions (Signed)
Get help right away if: You have a racing heart and shortness of breath. You have thoughts of hurting yourself or others. 

## 2018-07-10 NOTE — ED Provider Notes (Signed)
  August- ? MOLD toxicity Detox protocol with chiropractor  Anxiety Patient getting TTS   Patient safe for OP work up.    Arthor CaptainHarris, Deepak Bless, PA-C 07/10/18 2338    Terrilee FilesButler, Michael C, MD 07/12/18 [phone]

## 2018-07-10 NOTE — ED Triage Notes (Signed)
Pt went to her PCP yesterday for ongoing abdominal pain and some trouble breathing. She was diagnosed with PNA but was sent here for CT. Pt also reports increased anxiety d/t black mold exposure in her home.

## 2018-07-10 NOTE — BH Assessment (Addendum)
Tele Assessment Note   Patient Name: Belinda Tucker MRN: [id number] Referring Physician: Charm Barges Location of Patient: [facility] ED Location of Provider: Behavioral Health TTS Department  Belinda Tucker is an 42 y.o. female.  The pt came in after having some shortness of breath, which the pt thinks is related to taking the Lexapro. The pt is denying SI and stated she feels better mentally after being on the Lexapro.  She stated she is sleeping and eating well.  The pt was hospitalized at [facility] a month ago due to not feeling like she could take care of herself. The pt is stressed about not being able to see her son and financial problems.  She is seeing a Chief Operating Officer at [employer].  The pt is living with her mother in law, which the pt does not like.  The pt denies self harm, HI, legal issues and hallucinations.  The pt denies SA.  Pt is dressed in casual clothes. She is alert and oriented x4. Pt speaks in a clear tone, at moderate volume and normal pace. Eye contact is good. Pt's mood is pleasant. Thought process is coherent and relevant. There is no indication Pt is currently responding to internal stimuli or experiencing delusional thought content.?Pt was cooperative throughout assessment.    Diagnosis:  F41.1 Generalized anxiety disorder  Past Medical History:  Past Medical History:  Diagnosis Date  . Cervical dysplasia 2013  . Endometriosis   . Fibrocystic breast   . H/O thyroidectomy   . Thyroid disease    Goiters    Past Surgical History:  Procedure Laterality Date  . APPENDECTOMY    . COLPOSCOPY    . PELVIC LAPAROSCOPY  1996  . THYROIDECTOMY      Family History:  Family History  Problem Relation Age of Onset  . Hypertension Father   . Hyperlipidemia Father   . Rheum arthritis Mother     Social History:  reports that she has quit smoking. She has never used smokeless tobacco. She reports that she does not drink alcohol or use drugs.  Additional Social  History:  Alcohol / Drug Use Pain Medications: See MAR Prescriptions: See MAR Over the Counter: See MAR History of alcohol / drug use?: No history of alcohol / drug abuse Longest period of sobriety (when/how long): NA  CIWA: CIWA-Ar BP: 115/63 Pulse Rate: 69 COWS:    Allergies:  Allergies  Allergen Reactions  . Latex Hives  . Morphine And Related Hives    Home Medications:  (Not in a hospital admission)  OB/GYN Status:  No LMP recorded.  General Assessment Data Location of Assessment: [facility] ED TTS Assessment: In system Is this a Tele or Face-to-Face Assessment?: Face-to-Face Is this an Initial Assessment or a Re-assessment for this encounter?: Initial Assessment Patient Accompanied by:: N/A Language Other than English: No Living Arrangements: Other (Comment)(home) What gender do you identify as?: Female Marital status: Single Maiden name: Self pay Pregnancy Status: No Living Arrangements: Non-relatives/Friends Can pt return to current living arrangement?: No Admission Status: Voluntary Is patient capable of signing voluntary admission?: Yes Referral Source: Self/Family/Friend Insurance type: Self pay     Crisis Care Plan Living Arrangements: Non-relatives/Friends Legal Guardian: Other:(Self) Name of Psychiatrist: Vesta Mixer Name of Therapist: [facility]  Education Status Is patient currently in school?: No Is the patient employed, unemployed or receiving disability?: Unemployed  Risk to self with the past 6 months Suicidal Ideation: No Has patient been a risk to self within the past 6  months prior to admission? : No Suicidal Intent: No Has patient had any suicidal intent within the past 6 months prior to admission? : No Is patient at risk for suicide?: No Suicidal Plan?: No Has patient had any suicidal plan within the past 6 months prior to admission? : No Access to Means: No What has been your use of drugs/alcohol within the last 12 months?: none Previous  Attempts/Gestures: No How many times?: 0 Other Self Harm Risks: none Triggers for Past Attempts: None known Intentional Self Injurious Behavior: None Family Suicide History: No Recent stressful life event(s): Other (Comment)(not seeing children) Persecutory voices/beliefs?: No Depression: No Substance abuse history and/or treatment for substance abuse?: No Suicide prevention information given to non-admitted patients: Not applicable  Risk to Others within the past 6 months Homicidal Ideation: No Does patient have any lifetime risk of violence toward others beyond the six months prior to admission? : No Thoughts of Harm to Others: No Current Homicidal Intent: No Current Homicidal Plan: No Access to Homicidal Means: No Identified Victim: none History of harm to others?: No Assessment of Violence: None Noted Violent Behavior Description: none Does patient have access to weapons?: No Criminal Charges Pending?: No Does patient have a court date: No Is patient on probation?: No  Psychosis Hallucinations: None noted Delusions: None noted  Mental Status Report Appearance/Hygiene: Unremarkable Eye Contact: Good Motor Activity: Freedom of movement, Unremarkable Speech: Logical/coherent Level of Consciousness: Alert Mood: Pleasant Affect: Appropriate to circumstance Anxiety Level: Minimal Thought Processes: Coherent, Relevant Judgement: Partial Orientation: Person, Place, Time, Situation Obsessive Compulsive Thoughts/Behaviors: None  Cognitive Functioning Concentration: Normal Memory: Recent Intact, Remote Intact Is patient IDD: No Insight: Fair Impulse Control: Good Appetite: Good Have you had any weight changes? : Gain Amount of the weight change? (lbs): 20 lbs(with lexapro) Sleep: No Change Total Hours of Sleep: 8 Vegetative Symptoms: None  ADLScreening [facility] Assessment Services) Patient's cognitive ability adequate to safely complete daily activities?: Yes Patient  able to express need for assistance with ADLs?: Yes Independently performs ADLs?: Yes (appropriate for developmental age)  Prior Inpatient Therapy Prior Inpatient Therapy: Yes Prior Therapy Dates: 05/2018 Prior Therapy Facilty/Provider(s): [facility] Reason for Treatment: depression  Prior Outpatient Therapy Prior Outpatient Therapy: Yes Prior Therapy Dates: current Prior Therapy Facilty/Provider(s): [facility] Reason for Treatment: depression Does patient have an ACCT team?: No Does patient have Intensive In-House Services?  : No Does patient have [facility] services? : Yes Does patient have P4CC services?: No  ADL Screening (condition at time of admission) Patient's cognitive ability adequate to safely complete daily activities?: Yes Patient able to express need for assistance with ADLs?: Yes Independently performs ADLs?: Yes (appropriate for developmental age)       Abuse/Neglect Assessment (Assessment to be complete while patient is alone) Abuse/Neglect Assessment Can Be Completed: Yes Physical Abuse: Denies Verbal Abuse: Denies Sexual Abuse: Denies Exploitation of patient/patient's resources: Denies Self-Neglect: Denies Values / Beliefs Cultural Requests During Hospitalization: None Spiritual Requests During Hospitalization: None Consults Spiritual Care Consult Needed: No Social Work Consult Needed: No Merchant navy officer (For Healthcare) Does Patient Have a Medical Advance Directive?: No Would patient like information on creating a medical advance directive?: No - Patient declined          Disposition:  Disposition Initial Assessment Completed for this Encounter: Yes Patient referred to: Other (Comment)(back to [place])   NP Kayren Eaves recommends the pt be discharged and follow up with [facility].  RN Seward Grater and MD Adela Lank were made aware of the  recommendations.  This service was provided via telemedicine using a 2-way, interactive audio and video  technology.  Names of all persons participating in this telemedicine service and their role in this encounter. Name: Belinda Tucker Role: Pt  Name: Belinda Tucker Role: TTS  Name:  Role:   Name:  Role:     Ottis StainGarvin, Phares Zaccone Jermaine 07/10/2018 5:27 PM

## 2018-07-10 NOTE — ED Notes (Signed)
Pt verbalized understanding of discharge paperwork and follow-up care.  °

## 2018-07-10 NOTE — ED Provider Notes (Signed)
[facility] EMERGENCY DEPARTMENT Provider Note   CSN: [id number] Arrival date & time: 07/10/18  1001     History   Chief Complaint Chief Complaint  Patient presents with  . Pneumonia    HPI Belinda Tucker is a 42 y.o. female.  42 year old female presents with complaint of decline in health status.  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  Patient states prior to this she was in excellent health, a daily runner and did not have any significant health problems.  Patient states that since August she has developed episodes of shortness of breath, tingling in her hands and feet, pressure in her abdomen, discomfort in her right leg, fatigue.  Patient has been admitted to behavioral health as her PCP could not manage psychiatric medications for her without complicated side effects/intolerances.  Patient states she is gained 20 pounds due to taking Lexapro, feels slightly improved on Lexapro however does not like the side effects.  States that she has episodes of hypotension and bradycardia.  Patient was seen by her PCP at [facility] yesterday who did a chest x-ray and told her that she had fluid in various parts of her lungs.  Patient has a report from her chest x-ray which shows hyperinflation, otherwise negative.  Patient is also under the care of a chiropractor who has diagnosed her with mold toxicity and she is working through a detox protocol for this.  Patient has worked as a Solicitor and prefers a more holistic approach to her health and wellness.  Patient states that she found mold in her air conditioning system at home and moved to a new home and within a few weeks of being in the new home found mold in that home as well and has recently moved herself and her children out.  Patient's ex-husband is currently caring for the children so patient can focus on her  health.  Patient is struggling with family relationships, states that her family does not understand her illnesses.  She denies suicidal ideation, denies difficulty sleeping.  No other complaints or concerns.     Past Medical History:  Diagnosis Date  . Cervical dysplasia 2013  . Endometriosis   . Fibrocystic breast   . H/O thyroidectomy   . Thyroid disease    Goiters    Patient Active Problem List   Diagnosis Date Noted  . MDD (major depressive disorder), recurrent episode, severe (HCC) 05/29/2018  . MDD (major depressive disorder), severe (HCC) 05/29/2018    Past Surgical History:  Procedure Laterality Date  . APPENDECTOMY    . COLPOSCOPY    . PELVIC LAPAROSCOPY  1996  . THYROIDECTOMY       OB History    Gravida  2   Para  2   Term      Preterm      AB      Living  2     SAB      TAB      Ectopic      Multiple      Live Births               Home Medications    Prior to Admission medications   Medication Sig Start Date End Date Taking? Authorizing Provider  escitalopram (LEXAPRO) 5 MG tablet Take 1 tablet (5 mg total) by mouth daily. 06/05/18   Oneta Rack,  NP  LORazepam (ATIVAN) 0.5 MG tablet Take 1 tablet (0.5 mg total) by mouth every 8 (eight) hours as needed for anxiety. 06/04/18   Oneta RackLewis, Tanika N, NP  mirtazapine (REMERON) 7.5 MG tablet Take 1 tablet (7.5 mg total) by mouth at bedtime. 06/04/18   Oneta RackLewis, Tanika N, NP  thyroid (ARMOUR) 15 MG tablet Take 5 tablets (75 mg total) by mouth daily before breakfast. 06/05/18   Oneta RackLewis, Tanika N, NP    Family History Family History  Problem Relation Age of Onset  . Hypertension Father   . Hyperlipidemia Father   . Rheum arthritis Mother     Social History Social History   Tobacco Use  . Smoking status: Former Games developermoker  . Smokeless tobacco: Never Used  Substance Use Topics  . Alcohol use: No    Alcohol/week: 0.0 standard drinks  . Drug use: No     Allergies   Latex and Morphine and  related   Review of Systems Review of Systems  Constitutional: Positive for fatigue.  HENT: Positive for congestion.   Respiratory: Positive for shortness of breath.   Cardiovascular: Positive for chest pain.  Gastrointestinal: Positive for abdominal distention and abdominal pain. Negative for constipation, diarrhea, nausea and vomiting.  Genitourinary: Negative for difficulty urinating.  Musculoskeletal: Positive for myalgias.  Skin: Negative for rash.  Allergic/Immunologic: Negative for immunocompromised state.  Neurological: Positive for weakness and numbness.  Psychiatric/Behavioral: Negative for confusion, sleep disturbance and suicidal ideas. The patient is nervous/anxious.   All other systems reviewed and are negative.    Physical Exam Updated Vital Signs BP 115/63   Pulse 69   Temp 98.1 F (36.7 C) (Oral)   Resp 18   Ht 5\' 6"  (1.676 m)   Wt 63.5 kg   SpO2 100%   BMI 22.60 kg/m   Physical Exam  Constitutional: She is oriented to person, place, and time. She appears well-developed and well-nourished. No distress.  HENT:  Head: Normocephalic and atraumatic.  Nose: Nose normal.  Mouth/Throat: Oropharynx is clear and moist. No oropharyngeal exudate.  Eyes: Pupils are equal, round, and reactive to light. Conjunctivae and EOM are normal. No scleral icterus.  Neck: Neck supple.  Cardiovascular: Normal rate, regular rhythm, normal heart sounds and intact distal pulses.  No murmur heard. Pulmonary/Chest: Effort normal and breath sounds normal. No respiratory distress.  Abdominal: Soft. There is no tenderness.  Musculoskeletal: She exhibits no tenderness.  Neurological: She is alert and oriented to person, place, and time. No sensory deficit. She exhibits normal muscle tone.  Skin: Skin is warm and dry. No rash noted. She is not diaphoretic.  Psychiatric: Her speech is normal and behavior is normal. Her mood appears anxious. Thought content is paranoid. Cognition and  memory are normal.  Nursing note and vitals reviewed.    ED Treatments / Results  Labs (all labs ordered are listed, but only abnormal results are displayed) Labs Reviewed  COMPREHENSIVE METABOLIC PANEL - Abnormal; Notable for the following components:      Result Value   Potassium 3.3 (*)    CO2 18 (*)    Glucose, Bld 148 (*)    All other components within normal limits  URINALYSIS, ROUTINE W REFLEX MICROSCOPIC - Abnormal; Notable for the following components:   Glucose, UA >=500 (*)    Hgb urine dipstick MODERATE (*)    Bacteria, UA RARE (*)    All other components within normal limits  CBC WITH DIFFERENTIAL/PLATELET  LIPASE, BLOOD  TROPONIN I  D-DIMER, QUANTITATIVE (NOT AT [facility])  TSH    EKG EKG Interpretation  Date/Time:  Tuesday July 10 2018 11:17:05 EST Ventricular Rate:  69 PR Interval:    QRS Duration: 78 QT Interval:  380 QTC Calculation: 408 R Axis:   85 Text Interpretation:  Sinus rhythm no acute st/ts similar to prior 10/19 Confirmed by Meridee Score [id number]) on 07/10/2018 12:11:55 PM   Radiology Dg Chest 2 View  Result Date: 07/10/2018 CLINICAL DATA:  Chest discomfort, cough, congestion and shortness of breath for 3 weeks. EXAM: CHEST - 2 VIEW COMPARISON:  None. FINDINGS: The lungs are clear. Heart size is normal. There is no pneumothorax or pleural effusion. No acute or focal bony abnormality is identified. IMPRESSION: Negative chest. Electronically Signed   By: Drusilla Kanner M.D.   On: 07/10/2018 10:59    Procedures Procedures (including critical care time)  Medications Ordered in ED Medications - No data to display   Initial Impression / Assessment and Plan / ED Course  I have reviewed the triage vital signs and the nursing notes.  Pertinent labs & imaging results that were available during my care of the patient were reviewed by me and considered in my medical decision making (see chart for details).    Final Clinical Impressions(s)  / ED Diagnoses   Final diagnoses:  None    ED Discharge Orders    None       Jeannie Fend, PA-C 07/10/18 1550    Terrilee Files, MD 07/10/18 [phone]

## 2018-07-10 NOTE — BH Assessment (Signed)
NP Kayren Eavesakia Starks recommends the pt be discharged [facility].  MD Adela LankFloyd was made aware of the recommendation.

## 2018-07-10 NOTE — ED Notes (Signed)
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 Shortness of breath getting worse in past 6 weeks. Has seen psychiatrist yesterday, would like something for nerves. Crying - would like some labs drawn also. last BM- this am, last period 3 weeks ago- does not have regular periods.  States psychiatrist would like to have liver/pancreas and thyroid tested- takes thyroid medicine every morning.

## 2018-08-28 ENCOUNTER — Ambulatory Visit: Payer: Self-pay | Admitting: Internal Medicine

## 2018-08-28 ENCOUNTER — Encounter: Payer: Self-pay | Admitting: Internal Medicine

## 2018-08-28 VITALS — BP 138/80 | HR 62 | Resp 12 | Ht 65.0 in | Wt 148.0 lb

## 2018-08-28 DIAGNOSIS — R739 Hyperglycemia, unspecified: Secondary | ICD-10-CM

## 2018-08-28 DIAGNOSIS — F419 Anxiety disorder, unspecified: Secondary | ICD-10-CM

## 2018-08-28 DIAGNOSIS — E890 Postprocedural hypothyroidism: Secondary | ICD-10-CM

## 2018-08-28 DIAGNOSIS — F329 Major depressive disorder, single episode, unspecified: Secondary | ICD-10-CM

## 2018-08-28 DIAGNOSIS — E876 Hypokalemia: Secondary | ICD-10-CM
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
## 2018-08-29 LAB — TSH: TSH: 0.572 u[IU]/mL (ref 0.450–4.500)

## 2018-08-29 LAB — COMPREHENSIVE METABOLIC PANEL
ALT: 14 IU/L (ref 0–32)
AST: 13 IU/L (ref 0–40)
Albumin/Globulin Ratio: 1.9 (ref 1.2–2.2)
Albumin: 4.2 g/dL (ref 3.5–5.5)
Alkaline Phosphatase: 39 IU/L (ref 39–117)
BILIRUBIN TOTAL: 0.8 mg/dL (ref 0.0–1.2)
BUN/Creatinine Ratio: 13 (ref 9–23)
BUN: 17 mg/dL (ref 6–24)
CHLORIDE: 106 mmol/L (ref 96–106)
CO2: 19 mmol/L — AB (ref 20–29)
CREATININE: 1.28 mg/dL — AB (ref 0.57–1.00)
Calcium: 9.2 mg/dL (ref 8.7–10.2)
GFR calc Af Amer: 60 mL/min/{1.73_m2} (ref >59)
GFR calc non Af Amer: 52 mL/min/{1.73_m2} — ABNORMAL LOW (ref >59)
Globulin, Total: 2.2 g/dL (ref 1.5–4.5)
SODIUM: 143 mmol/L (ref 134–144)
Total Protein: 6.4 g/dL (ref 6.0–8.5)

## 2018-08-29 LAB — T4, FREE: FREE T4: 0.88 ng/dL (ref 0.82–1.77)

## 2018-08-30 ENCOUNTER — Telehealth: Payer: Self-pay | Admitting: Internal Medicine

## 2018-08-30 NOTE — Telephone Encounter (Signed)
Social Work Intern Makalynn Berwanger Roberts-Kollie called Tresa Endo and left her a message welcoming her to [facility] and to inform her about counseling and mental health services we provide. Left her a message.

## 2018-09-06 ENCOUNTER — Encounter: Payer: Self-pay | Admitting: Internal Medicine

## 2018-09-06 ENCOUNTER — Telehealth: Payer: Self-pay

## 2018-09-06 NOTE — Telephone Encounter (Signed)
Patient called highly upset that she has not received her lab results that was done on Friday. States it has been 8 days and she is tired of sweating. She would like to get her thyroid regulated. States she will be expecting a call back today.  To Dr. Delrae Alfred for further direction.

## 2018-09-07 ENCOUNTER — Encounter: Payer: Self-pay | Admitting: Internal Medicine

## 2018-09-07 ENCOUNTER — Telehealth: Payer: Self-pay | Admitting: Internal Medicine

## 2018-09-07 NOTE — Telephone Encounter (Signed)
Discussed labs findings at length  1.  Elevated Creatinine: encouraged patient to come in for repeat BMP and UA to be certain her kidney function is okay.  Discussed again, the imaging does not evaluate function.  CT was without contrast. She is obtaining some free care through an old provider at [facility] is apparently where she obtained the recent CT scan and previous Ultrasound of kidneys (see electronic message from patient)  She will call when she is ready to come in for lab.  Encouraged her to pick one primary care and obtain care through them  2.  Low normal TSH and Low normal FT4:  Discussed would be fine to decrease her dose of her T3/T4 combination hormone, but to let us know when and what dose she chooses and to come in 6 weeks later for repeat TSH, FT4 and looking into cost of FT3. Discussed based on her low normal FT4 level, unlikely that her symptoms of which she is concerned are from over treatment of the hormone she is taking, but again, would be fine to decrease dose and see.  I would also refer her to Endocrine, but again, we do not have anyone in [place] with the orange card and would have to refer to [facility] or [facility] and have her apply for financial assistance.  She will contemplate all of above and get back with [place].

## 2018-09-07 NOTE — Telephone Encounter (Signed)
Called patient and left message as she identified herself on phone. Discussed I had misspoken regarding listing levothyroxine as he thyroid hormone replacement rather than the [employer] thyroid like product she is using. Discussed we could discuss her hormone levels, but that based on what returned, do not feel she is over replaced with her current dosing. If she wants to decrease her dose and see if that helps, would be fine Discussed the imaging studies she has obtained recently would not show function, but really just anatomy.  Would discuss this with her as well.  Would still like to recheck her BMP and check UA. Would also like to see her FSH and LH levels and see where this is being checked (from what office). I see she has been seen recently [facility] at [facility], but do not any of the imaging or blood studies she states were recently checked in her correspondence.

## 2018-09-07 NOTE — Telephone Encounter (Signed)
Called.

## 2018-09-10 ENCOUNTER — Ambulatory Visit: Payer: Self-pay | Admitting: Internal Medicine

## 2018-09-14 ENCOUNTER — Other Ambulatory Visit: Payer: Self-pay

## 2018-09-14 DIAGNOSIS — R7989 Other specified abnormal findings of blood chemistry: Secondary | ICD-10-CM

## 2018-09-15 LAB — BASIC METABOLIC PANEL
BUN / CREAT RATIO: 17 (ref 9–23)
BUN: 16 mg/dL (ref 6–24)
CO2: 20 mmol/L (ref 20–29)
CREATININE: 0.93 mg/dL (ref 0.57–1.00)
Calcium: 8.5 mg/dL — ABNORMAL LOW (ref 8.7–10.2)
Chloride: 106 mmol/L (ref 96–106)
GFR calc Af Amer: 88 mL/min/{1.73_m2} (ref >59)
GFR, EST NON AFRICAN AMERICAN: 76 mL/min/{1.73_m2} (ref >59)
Glucose: 111 mg/dL — ABNORMAL HIGH (ref 65–99)
Potassium: 4.2 mmol/L (ref 3.5–5.2)
Sodium: 141 mmol/L (ref 134–144)

## 2018-09-25 ENCOUNTER — Encounter: Payer: Self-pay | Admitting: Internal Medicine

## 2018-10-30 ENCOUNTER — Telehealth: Payer: Self-pay

## 2018-10-30 NOTE — Telephone Encounter (Signed)
Patient called requesting to speak directly to Dr. Delrae Alfred regarding specialty labs she had done. patient states she did not want to speak with me regarding this issue but would prefer a call directly from Dr. Delrae Alfred.  To Dr. Delrae Alfred for further direction

## 2018-10-31 ENCOUNTER — Encounter: Payer: Self-pay | Admitting: Internal Medicine

## 2018-10-31 NOTE — Telephone Encounter (Signed)
Patient messaged she had labs done.   Has made changes to her meds. Have asked her to fax to our fax number to review

## 2018-11-01 ENCOUNTER — Encounter: Payer: Self-pay | Admitting: Internal Medicine

## 2018-11-01 DIAGNOSIS — E890 Postprocedural hypothyroidism: Secondary | ICD-10-CM | POA: Insufficient documentation

## 2018-11-01 DIAGNOSIS — R739 Hyperglycemia, unspecified: Secondary | ICD-10-CM | POA: Insufficient documentation

## 2018-11-24 ENCOUNTER — Encounter: Payer: Self-pay | Admitting: Internal Medicine

## 2018-11-26 ENCOUNTER — Telehealth: Payer: Self-pay | Admitting: Internal Medicine

## 2018-11-26 NOTE — Telephone Encounter (Signed)
See labs patient sent in her email from [facility]. Apparently someone there writing for her labs. Discussed labs at length. Cholesterol fine. CBC fine  CMP save for Tbil at 1.1 (minimally elevated) fine. Iron studies fine--TIBC slightly elevated. Fasting glucose was 124 with another A1C in one month's time of 5.2% TSH up to 0.482, Free T4 at 0.95.  Total and not free T3 obtained and measured somewhat high at 188.41. 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   She is hot all the time, bloated.  Eating well and exercising well. Has an appt with Gyne this week to discuss further evaluation of endometrium with bloating, frequent periods. LH and FSH have been fine.  Discussed need to give her decrease in desiccated thyroid replacement more time.  Would recheck in another month. Recommended that she not need to check her labs so frequently. To try getting free T3 with next blood draw in one month.

## 2018-11-26 NOTE — Telephone Encounter (Signed)
Patient called requesting a call back from Dr. Delrae Alfred; patient states was told to call  clinic this week. Please advise.

## 2018-11-29 ENCOUNTER — Encounter: Payer: Self-pay | Admitting: Internal Medicine

## 2019-01-25 ENCOUNTER — Encounter: Payer: Self-pay | Admitting: Gynecology

## 2019-01-28 ENCOUNTER — Encounter: Payer: Self-pay | Admitting: Gynecology

## 2019-03-02 IMAGING — DX DG CHEST 2V
2 series · 2 of 2 positions shown · non-contrast
Comparison: None.

CLINICAL DATA: Chest discomfort, cough, congestion and shortness of
breath for 3 weeks.

EXAM:
CHEST - 2 VIEW

[chest pa]
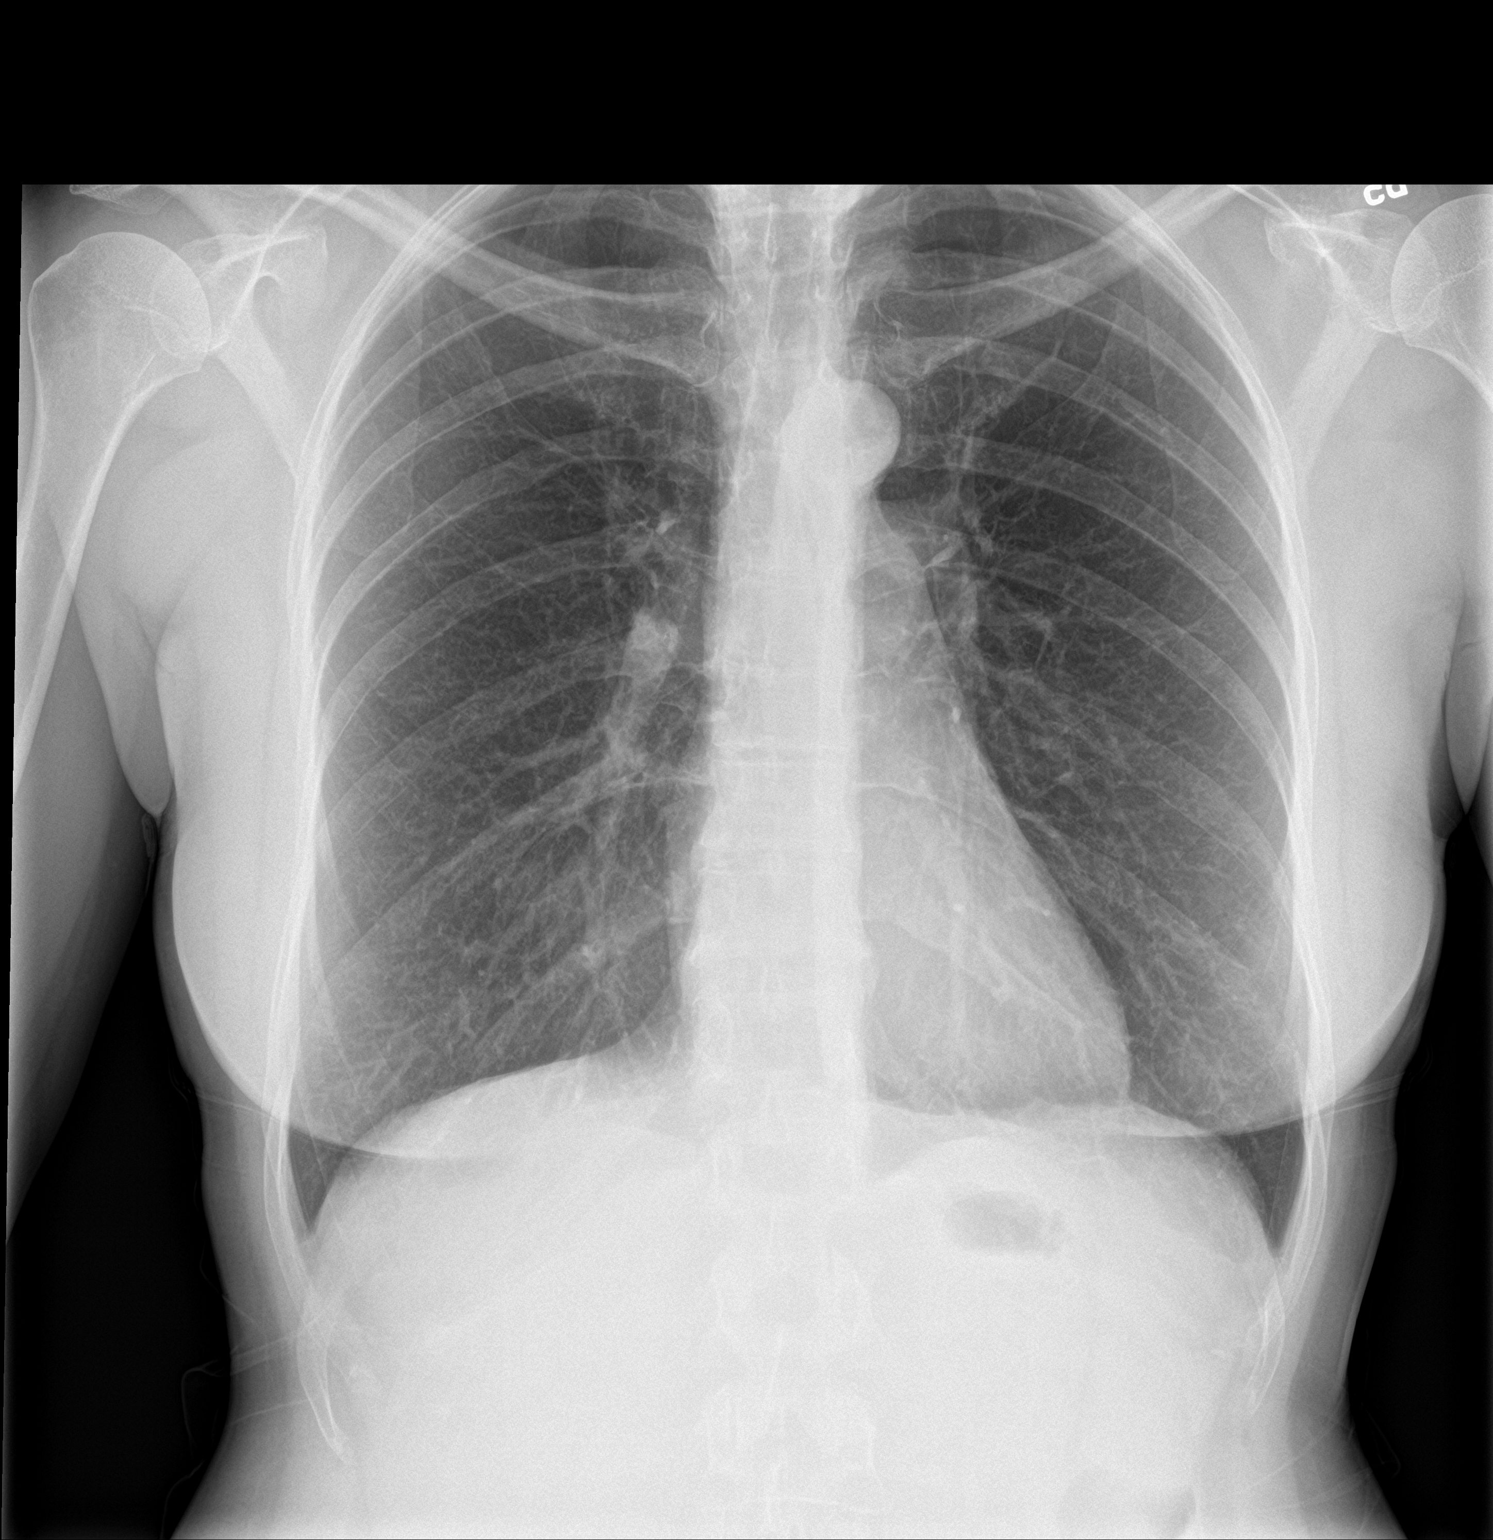

[chest lat]
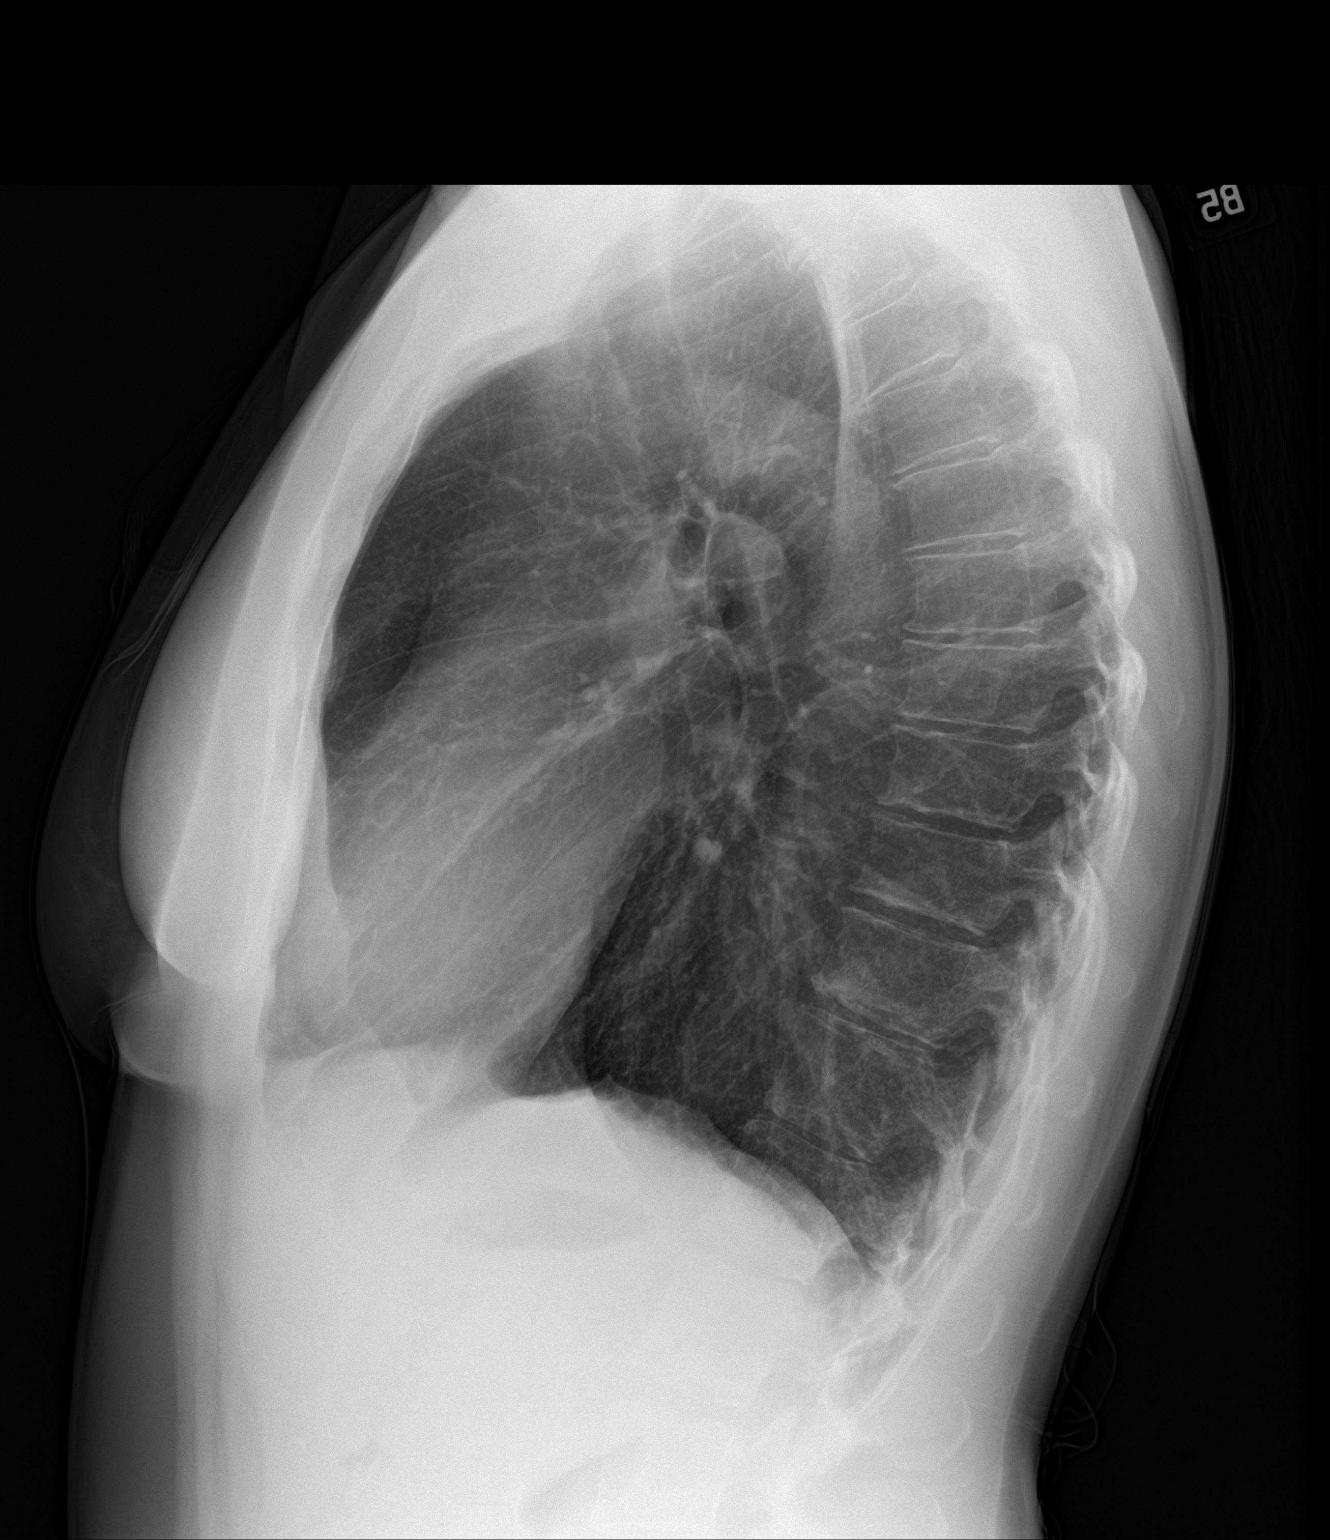

[2 of 2 positions shown; findings below may reference images not displayed]

FINDINGS: The lungs are clear. Heart size is normal. There is no pneumothorax
or pleural effusion. No acute or focal bony abnormality is
identified.
IMPRESSION: Negative chest.

## 2019-05-07 ENCOUNTER — Encounter: Payer: Self-pay | Admitting: Gynecology
